# Patient Record
Sex: Male | Born: 1965 | Race: White | Hispanic: No | Marital: Single | Smoking: Former smoker
Health system: Southern US, Community
[De-identification: ages and names within clinical notes are randomized; demographics above are authoritative.]

## PROBLEM LIST (undated history)

## (undated) DIAGNOSIS — I872 Venous insufficiency (chronic) (peripheral): Secondary | ICD-10-CM

## (undated) DIAGNOSIS — K635 Polyp of colon: Secondary | ICD-10-CM

## (undated) DIAGNOSIS — J45909 Unspecified asthma, uncomplicated: Secondary | ICD-10-CM

## (undated) DIAGNOSIS — E669 Obesity, unspecified: Secondary | ICD-10-CM

## (undated) DIAGNOSIS — G4733 Obstructive sleep apnea (adult) (pediatric): Secondary | ICD-10-CM

## (undated) DIAGNOSIS — Z87891 Personal history of nicotine dependence: Secondary | ICD-10-CM

## (undated) DIAGNOSIS — I10 Essential (primary) hypertension: Secondary | ICD-10-CM

## (undated) DIAGNOSIS — E789 Disorder of lipoprotein metabolism, unspecified: Secondary | ICD-10-CM

## (undated) DIAGNOSIS — T7840XA Allergy, unspecified, initial encounter: Secondary | ICD-10-CM

## (undated) HISTORY — DX: Personal history of nicotine dependence: Z87.891

## (undated) HISTORY — DX: Disorder of lipoprotein metabolism, unspecified: E78.9

## (undated) HISTORY — DX: Obstructive sleep apnea (adult) (pediatric): G47.33

## (undated) HISTORY — DX: Allergy, unspecified, initial encounter: T78.40XA

## (undated) HISTORY — PX: COLONOSCOPY W/ POLYPECTOMY: SHX1380

## (undated) HISTORY — DX: Unspecified asthma, uncomplicated: J45.909

## (undated) HISTORY — DX: Essential (primary) hypertension: I10

## (undated) HISTORY — PX: KNEE ARTHROSCOPY: SUR90

## (undated) HISTORY — PX: ROUX-EN-Y GASTRIC BYPASS: SHX1104

## (undated) HISTORY — DX: Polyp of colon: K63.5

## (undated) HISTORY — DX: Obesity, unspecified: E66.9

---
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
## 2014-08-04 DIAGNOSIS — T7840XA Allergy, unspecified, initial encounter: Secondary | ICD-10-CM | POA: Insufficient documentation

## 2014-08-04 DIAGNOSIS — J45909 Unspecified asthma, uncomplicated: Secondary | ICD-10-CM | POA: Insufficient documentation

## 2014-08-04 DIAGNOSIS — I10 Essential (primary) hypertension: Secondary | ICD-10-CM | POA: Insufficient documentation

## 2014-08-04 DIAGNOSIS — Z9989 Dependence on other enabling machines and devices: Secondary | ICD-10-CM

## 2014-08-04 DIAGNOSIS — G4733 Obstructive sleep apnea (adult) (pediatric): Secondary | ICD-10-CM | POA: Insufficient documentation

## 2014-08-25 ENCOUNTER — Ambulatory Visit: Payer: Self-pay | Admitting: Family Medicine

## 2014-09-19 ENCOUNTER — Ambulatory Visit: Payer: Self-pay | Admitting: Family Medicine

## 2016-10-16 ENCOUNTER — Encounter: Payer: Self-pay | Admitting: Physician Assistant

## 2017-03-11 LAB — HM COLONOSCOPY

## 2017-09-03 ENCOUNTER — Encounter (INDEPENDENT_AMBULATORY_CARE_PROVIDER_SITE_OTHER): Payer: Self-pay

## 2017-09-03 ENCOUNTER — Ambulatory Visit (INDEPENDENT_AMBULATORY_CARE_PROVIDER_SITE_OTHER): Payer: Managed Care, Other (non HMO) | Admitting: Physician Assistant

## 2017-09-03 ENCOUNTER — Encounter: Payer: Self-pay | Admitting: Physician Assistant

## 2017-09-03 VITALS — BP 149/79 | HR 82 | Temp 97.9°F | Ht 73.0 in | Wt >= 6400 oz

## 2017-09-03 DIAGNOSIS — Z7689 Persons encountering health services in other specified circumstances: Secondary | ICD-10-CM

## 2017-09-03 DIAGNOSIS — J4530 Mild persistent asthma, uncomplicated: Secondary | ICD-10-CM

## 2017-09-03 DIAGNOSIS — Z130 Encounter for screening for diseases of the blood and blood-forming organs and certain disorders involving the immune mechanism: Secondary | ICD-10-CM | POA: Diagnosis not present

## 2017-09-03 DIAGNOSIS — Z13220 Encounter for screening for lipoid disorders: Secondary | ICD-10-CM

## 2017-09-03 DIAGNOSIS — Z87891 Personal history of nicotine dependence: Secondary | ICD-10-CM

## 2017-09-03 DIAGNOSIS — Z9989 Dependence on other enabling machines and devices: Secondary | ICD-10-CM

## 2017-09-03 DIAGNOSIS — E66813 Obesity, class 3: Secondary | ICD-10-CM

## 2017-09-03 DIAGNOSIS — I10 Essential (primary) hypertension: Secondary | ICD-10-CM

## 2017-09-03 DIAGNOSIS — Z131 Encounter for screening for diabetes mellitus: Secondary | ICD-10-CM

## 2017-09-03 DIAGNOSIS — G4733 Obstructive sleep apnea (adult) (pediatric): Secondary | ICD-10-CM | POA: Diagnosis not present

## 2017-09-03 DIAGNOSIS — Z6841 Body Mass Index (BMI) 40.0 and over, adult: Secondary | ICD-10-CM

## 2017-09-03 DIAGNOSIS — Z23 Encounter for immunization: Secondary | ICD-10-CM | POA: Diagnosis not present

## 2017-09-03 MED ORDER — OLMESARTAN MEDOXOMIL-HCTZ 40-25 MG PO TABS: 1.0000 | ORAL_TABLET | Freq: Every day | ORAL | 0 refills | Status: DC

## 2017-09-03 MED ORDER — ASPIRIN EC 81 MG PO TBEC: 81.0000 mg | DELAYED_RELEASE_TABLET | Freq: Every day | ORAL | 3 refills | Status: AC

## 2017-09-03 MED ORDER — ADVAIR DISKUS 100-50 MCG/DOSE IN AEPB: 1.0000 | INHALATION_SPRAY | Freq: Two times a day (BID) | RESPIRATORY_TRACT | 2 refills | Status: DC

## 2017-09-03 NOTE — Progress Notes (Signed)
HPI:                                                                Jordan Moody is a 52 y.o. male who presents to [facility]: Primary Care Sports Medicine today to establish care  Current concerns: medication refills  OSA on CPAP: states he saw his sleep specialist in [place] 3 months ago. No concerns. Plans to establish with a sleep doctor here. Sleep study was completed at [facility] in [place], [place] 10 years ago.   Asthma: taking Advair daily. Has not used rescue inhaler in years. No exacerbations in the last year. Denies nighttime cough. Denies dyspnea or wheezing.   HTN: taking Benicar daily. Compliant with medications. Does not check BP's at home. Denies vision change, headache, chest pain with exertion, orthopnea, lightheadedness, syncope and edema. Risk factors include: obesity, male sex, family history of CAD   Depression screen [facility] 2/9 09/03/2017  Decreased Interest 0  Down, Depressed, Hopeless 0  PHQ - 2 Score 0    No flowsheet data found.    Past Medical History:  Diagnosis Date  . Allergy   . Asthma   . Colon polyps   . Former smoker   . Hypertension   . Obesity   . OSA (obstructive sleep apnea)    Past Surgical History:  Procedure Laterality Date  . COLONOSCOPY W/ POLYPECTOMY    . KNEE ARTHROSCOPY Right    Social History   Tobacco Use  . Smoking status: Former Smoker    Packs/day: 1.00    Years: 28.00    Pack years: 28.00    Types: Cigarettes    Last attempt to quit: 05/19/2013    Years since quitting: 4.2  . Smokeless tobacco: Never Used  Substance Use Topics  . Alcohol use: Yes    Alcohol/week: 3.6 oz    Types: 6 Standard drinks or equivalent per week   family history includes Colon cancer in his paternal grandfather; Heart attack in his maternal grandfather and paternal grandfather; Hyperlipidemia in his father; Hypertension in his mother; Stroke in his paternal grandfather.    ROS: negative except as  noted in the HPI  Medications: Current Outpatient Medications  Medication Sig Dispense Refill  . ADVAIR DISKUS 100-50 MCG/DOSE AEPB Inhale 1 puff into the lungs 2 (two) times daily. 60 each 2  . aspirin EC 81 MG tablet Take 1 tablet (81 mg total) by mouth daily. 90 tablet 3  . olmesartan-hydrochlorothiazide (BENICAR HCT) 40-25 MG tablet Take 1 tablet by mouth daily. 90 tablet 0   No current facility-administered medications for this visit.    No Known Allergies     Objective:  BP (!) 149/79   Pulse 82   Temp 97.9 F (36.6 C) (Oral)   Ht 6\' 1"  (1.854 m)   Wt (!) 411 lb (186.4 kg)   SpO2 95%   BMI 54.22 kg/m  Gen:  alert, not ill-appearing, no distress, appropriate for age, obese male HEENT: head normocephalic without obvious abnormality, conjunctiva and cornea clear, trachea midline Pulm: Normal work of breathing, normal phonation, clear to auscultation bilaterally, no wheezes, rales or rhonchi CV: Normal rate, regular rhythm, s1 and s2 distinct, no murmurs, clicks or rubs  Neuro: alert and  oriented x 3, no tremor MSK: extremities atraumatic, normal gait and station Skin: intact, no rashes on exposed skin, no jaundice, no cyanosis Psych: well-groomed, cooperative, good eye contact, euthymic mood, affect mood-congruent, speech is articulate, and thought processes clear and goal-directed    No results found for this or any previous visit (from the past 72 hour(s)). No results found.    Assessment and Plan: 52 y.o. male with   1. Encounter to establish care - reviewed PMh, PHS, PFH, medications and allergies - reviewed health maintenance - colonoscopy UTD 2017 per patient, [facility] - influenza given today - pneumovax UTD - negative PHQ2  2. Uncontrolled stage 2 hypertension BP Readings from Last 3 Encounters:  09/03/17 (!) 149/79  - BP severely elevated on initial BP check. Increasing thiazide from 12.5 to 25 mg -  counseled on therapeutic lifestyle changes - Goal <130/80 - baby aspirin for primary prevention - CBC - Comprehensive metabolic panel - Lipid Panel w/reflex Direct LDL - olmesartan-hydrochlorothiazide (BENICAR HCT) 40-25 MG tablet; Take 1 tablet by mouth daily.  Dispense: 90 tablet; Refill: 0  3. Class 3 severe obesity due to excess calories with serious comorbidity in adult, unspecified BMI (HCC) Wt Readings from Last 3 Encounters:  09/03/17 (!) 411 lb (186.4 kg)  - Lipid Panel w/reflex Direct LDL - Hemoglobin A1c  4. Mild persistent asthma without complication - SpO2 95% on RA at rest, likely a restrictive component due to co-morbid obesity. Well controlled on Advair - ADVAIR DISKUS 100-50 MCG/DOSE AEPB; Inhale 1 puff into the lungs 2 (two) times daily.  Dispense: 60 each; Refill: 2  5. OSA on CPAP - [place] sleep [place] from [facility] in [place]  - referring to pulmonology to establish with sleep specialist   6. Screening for diabetes mellitus - Hemoglobin A1c  7. Encounter for screening for lipid disorder - Lipid Panel w/reflex Direct LDL  8. Former heavy tobacco smoker   9. Need for immunization against influenza - Flu Vaccine QUAD 36+ mos IM  Patient education and anticipatory guidance given Patient agrees with treatment plan Follow-up in 2 weeks for nurse BP check, then every 6 months for medication or sooner  as needed  Levonne Hubertharley E. Rikki Trosper PA-C

## 2017-09-03 NOTE — Patient Instructions (Signed)
For your blood pressure: - Switch to higher dose of Benicar (40-25 mg) - Start baby aspirin 81 mg to help prevent heart attack/stroke - Check blood pressure at home for the next 2 weeks - Check around the same time each day in a relaxed setting - Limit salt to <2000 mg/day - Follow DASH eating plan - limit alcohol to 2 standard drinks per day - avoid tobacco products - weight loss: 7% of current body weight - Follow-up in 2 weeks   Physical Activity Recommendations for modifying lipids and lowering blood pressure Engage in aerobic physical activity to reduce LDL-cholesterol, non-HDL-cholesterol, and blood pressure  Frequency: 3-4 sessions per week  Intensity: moderate to vigorous  Duration: 40 minutes on average  Physical Activity Recommendations for secondary prevention 1. Aerobic exercise  Frequency: 3-5 sessions per week  Intensity: 50-80% capacity  Duration: 20 - 60 minutes  Examples: walking, treadmill, cycling, rowing, stair climbing, and arm/leg ergometry  2. Resistance exercise  Frequency: 2-3 sessions per week  Intensity: 10-15 repetitions/set to moderate fatigue  Duration: 1-3 sets of 8-10 upper and lower body exercises  Examples: calisthenics, elastic bands, cuff/hand weights, dumbbels, free weights, wall pulleys, and weight machines  Heart-Healthy Lifestyle  Eating a diet rich in vegetables, fruits and whole grains: also includes low-fat dairy products, poultry, fish, legumes, and nuts; limit intake of sweets, sugar-sweetened beverages and red meats  Getting regular exercise  Maintaining a healthy weight  Not smoking or getting help quitting  Staying on top of your health; for some people, lifestyle changes alone may not be enough to prevent a heart attack or stroke. In these cases, taking a statin at the right dose will most likely be necessary

## 2017-09-04 ENCOUNTER — Encounter: Payer: Self-pay | Admitting: Physician Assistant

## 2017-09-04 DIAGNOSIS — E789 Disorder of lipoprotein metabolism, unspecified: Secondary | ICD-10-CM | POA: Insufficient documentation

## 2017-09-04 HISTORY — DX: Disorder of lipoprotein metabolism, unspecified: E78.9

## 2017-09-04 LAB — COMPREHENSIVE METABOLIC PANEL
AG RATIO: 1.6 (calc) (ref 1.0–2.5)
ALBUMIN MSPROF: 4.4 g/dL (ref 3.6–5.1)
ALT: 28 U/L (ref 9–46)
AST: 17 U/L (ref 10–35)
Alkaline phosphatase (APISO): 52 U/L (ref 40–115)
BILIRUBIN TOTAL: 0.4 mg/dL (ref 0.2–1.2)
BUN: 15 mg/dL (ref 7–25)
CALCIUM: 9.7 mg/dL (ref 8.6–10.3)
CO2: 28 mmol/L (ref 20–32)
Chloride: 101 mmol/L (ref 98–110)
Creat: 0.71 mg/dL (ref 0.70–1.33)
GLUCOSE: 89 mg/dL (ref 65–99)
Globulin: 2.8 g/dL (calc) (ref 1.9–3.7)
POTASSIUM: 4.2 mmol/L (ref 3.5–5.3)
SODIUM: 137 mmol/L (ref 135–146)
TOTAL PROTEIN: 7.2 g/dL (ref 6.1–8.1)

## 2017-09-04 LAB — CBC
HEMATOCRIT: 40.9 % (ref 38.5–50.0)
Hemoglobin: 14.1 g/dL (ref 13.2–17.1)
MCH: 30.1 pg (ref 27.0–33.0)
MCHC: 34.5 g/dL (ref 32.0–36.0)
MCV: 87.2 fL (ref 80.0–100.0)
MPV: 10.8 fL (ref 7.5–12.5)
Platelets: 273 10*3/uL (ref 140–400)
RBC: 4.69 10*6/uL (ref 4.20–5.80)
RDW: 12.5 % (ref 11.0–15.0)
WBC: 5.5 10*3/uL (ref 3.8–10.8)

## 2017-09-04 LAB — LIPID PANEL W/REFLEX DIRECT LDL
Cholesterol: 195 mg/dL (ref <200)
HDL: 58 mg/dL (ref >40)
LDL CHOLESTEROL (CALC): 112 mg/dL — AB
NON-HDL CHOLESTEROL (CALC): 137 mg/dL — AB (ref <130)
Total CHOL/HDL Ratio: 3.4 (calc) (ref <5.0)
Triglycerides: 132 mg/dL (ref <150)

## 2017-09-04 LAB — HEMOGLOBIN A1C
HEMOGLOBIN A1C: 5.4 %{Hb} (ref <5.7)
Mean Plasma Glucose: 108 (calc)
eAG (mmol/L): 6 (calc)

## 2017-09-04 NOTE — Progress Notes (Signed)
Your labs look great - normal kidney function - cholesterol in a healthy range - normal blood counts - no evidence of diabetes

## 2017-09-19 ENCOUNTER — Ambulatory Visit (INDEPENDENT_AMBULATORY_CARE_PROVIDER_SITE_OTHER): Payer: Managed Care, Other (non HMO) | Admitting: Physician Assistant

## 2017-09-19 VITALS — BP 128/67 | HR 73 | Wt >= 6400 oz

## 2017-09-19 DIAGNOSIS — I10 Essential (primary) hypertension: Secondary | ICD-10-CM | POA: Diagnosis not present

## 2017-09-19 NOTE — Progress Notes (Signed)
Pt came into clinic today for BP check. At last OV his BP Rx was increased to Benicar 40-25mg . Pt has been taking the increased dose, no negative side effects. He also started weight watchers again, he is down 7 lbs. Advised to continue current therapy and see PCP for BP every 6 months. No refills needed at this time. No further questions.   Vitals:   09/19/17 1518  BP: 128/67  Pulse: 73

## 2017-11-25 ENCOUNTER — Other Ambulatory Visit: Payer: Self-pay | Admitting: Physician Assistant

## 2017-11-25 DIAGNOSIS — I10 Essential (primary) hypertension: Secondary | ICD-10-CM

## 2018-05-01 ENCOUNTER — Encounter: Payer: Self-pay | Admitting: Physician Assistant

## 2018-05-01 ENCOUNTER — Ambulatory Visit (INDEPENDENT_AMBULATORY_CARE_PROVIDER_SITE_OTHER): Payer: Managed Care, Other (non HMO) | Admitting: Physician Assistant

## 2018-05-01 VITALS — BP 114/74 | HR 99 | Temp 98.2°F | Wt >= 6400 oz

## 2018-05-01 DIAGNOSIS — I83028 Varicose veins of left lower extremity with ulcer other part of lower leg: Secondary | ICD-10-CM

## 2018-05-01 DIAGNOSIS — Z23 Encounter for immunization: Secondary | ICD-10-CM | POA: Diagnosis not present

## 2018-05-01 DIAGNOSIS — I83009 Varicose veins of unspecified lower extremity with ulcer of unspecified site: Secondary | ICD-10-CM | POA: Insufficient documentation

## 2018-05-01 DIAGNOSIS — I872 Venous insufficiency (chronic) (peripheral): Secondary | ICD-10-CM

## 2018-05-01 DIAGNOSIS — L97821 Non-pressure chronic ulcer of other part of left lower leg limited to breakdown of skin: Secondary | ICD-10-CM | POA: Diagnosis not present

## 2018-05-01 DIAGNOSIS — L97901 Non-pressure chronic ulcer of unspecified part of unspecified lower leg limited to breakdown of skin: Secondary | ICD-10-CM

## 2018-05-01 HISTORY — DX: Non-pressure chronic ulcer of unspecified part of unspecified lower leg limited to breakdown of skin: L97.901

## 2018-05-01 HISTORY — DX: Varicose veins of unspecified lower extremity with ulcer of unspecified site: I83.009

## 2018-05-01 HISTORY — DX: Venous insufficiency (chronic) (peripheral): I87.2

## 2018-05-01 MED ORDER — MEDICAL COMPRESSION SOCKS MISC: 0 refills | Status: DC

## 2018-05-01 NOTE — Progress Notes (Signed)
HPI:                                                                Jordan Moody is a 52 y.o. male who [name] today for rash/sore  For the last 10 days patient has noted a pruritic red rash on his left lower extremity.  He also noted a small sore that occasionally drained some clear fluid.  Rash is mildly tender.  Denies fever, chills, calf tenderness, claudication.   No flowsheet data found.    Past Medical History:  Diagnosis Date  . Allergy   . Asthma   . Borderline high cholesterol 09/04/2017   10-yr ASCVD risk 4.9%  . Colon polyps   . Former smoker   . Hypertension   . Obesity   . OSA (obstructive sleep apnea)    Past Surgical History:  Procedure Laterality Date  . COLONOSCOPY W/ POLYPECTOMY    . KNEE ARTHROSCOPY Right    Social History   Tobacco Use  . Smoking status: Former Smoker    Packs/day: 1.00    Years: 28.00    Pack years: 28.00    Types: Cigarettes    Last attempt to quit: 05/19/2013    Years since quitting: 4.9  . Smokeless tobacco: Never Used  Substance Use Topics  . Alcohol use: Yes    Alcohol/week: 6.0 standard drinks    Types: 6 Standard drinks or equivalent per week   family history includes Colon cancer in his paternal grandfather; Heart attack in his maternal grandfather and paternal grandfather; Hyperlipidemia in his father; Hypertension in his mother; Stroke in his paternal grandfather.    ROS: negative except as noted in the HPI  Medications: Current Outpatient Medications  Medication Sig Dispense Refill  . ADVAIR DISKUS 100-50 MCG/DOSE AEPB Inhale 1 puff into the lungs 2 (two) times daily. 60 each 2  . aspirin EC 81 MG tablet Take 1 tablet (81 mg total) by mouth daily. 90 tablet 3  . Elastic Bandages & Supports (MEDICAL COMPRESSION SOCKS) MISC L-XL circ., knee high, medium compression Wear daily 2 each 0  . olmesartan-hydrochlorothiazide (BENICAR HCT) 40-25 MG tablet  TAKE 1 TABLET BY MOUTH EVERY DAY 90 tablet 1   No current facility-administered medications for this visit.    No Known Allergies     Objective:  BP 114/74   Pulse 99   Temp 98.2 F (36.8 C) (Oral)   Wt (!) 424 lb (192.3 kg)   BMI 55.94 kg/m  Gen:  alert, not ill-appearing, no distress, appropriate for age, obese male Pulm: Normal work of breathing, normal phonation, clear to auscultation bilaterally, no wheezes, rales or rhonchi CV: Normal rate, regular rhythm, s1 and s2 distinct, no murmurs, clicks or rubs  MSK: extremities atraumatic, normal gait and station, 1+ peripheral edema to the mid-leg bilaterally Skin:  Left anterior lower extremity there is a purpuric rash with fine scale, there is a 2 cm x 2.5 cm shallow ulceration at the medial margin of the rash, no warmth, no induration, no drainage   No results found for this or any previous visit (from the past 72 hour(s)). No results found.    Assessment and Plan: 52 y.o. male with   .  Jordan Moody was seen today for wound check.  Diagnoses and all orders for this visit:  Venous stasis ulcer of other part of left lower leg limited to breakdown of skin, unspecified whether varicose veins present (HCC) -     Elastic Bandages & Supports (MEDICAL COMPRESSION SOCKS) MISC; L-XL circ., knee high, medium compression Wear daily  Need for immunization against influenza -     Flu Vaccine QUAD 36+ mos IM  Venous insufficiency of both lower extremities -     Elastic Bandages & Supports (MEDICAL COMPRESSION SOCKS) MISC; L-XL circ., knee high, medium compression Wear daily    Rash consistent with venous stasis dermatitis and early venous ulcer Patient placed in Unna boot Counseled on general measures for venous stasis including elevation, ambulation, and regular use of compression socks   Patient education and anticipatory guidance given Patient agrees with treatment plan Follow-up in 1 week for nurse unna boot change or sooner  as needed if symptoms worsen or fail to improve  Levonne Hubert PA-C

## 2018-05-01 NOTE — Patient Instructions (Signed)
Venous Ulcer A venous ulcer is a shallow sore on your lower leg that is caused by poor circulation in your veins. This condition used to be called stasis ulcer. Veins have valves that help return blood to the heart. If these valves do not work properly, it can cause blood to flow backward and to back up into the veins near the skin. When that happens, blood can pool in your lower legs. The blood can then leak out of your veins, which can irritate your skin. This may cause a break in your skin that becomes a venous ulcer. Venous ulcer is the most common type of lower leg ulcer. You may have venous ulcers on one leg or on both legs. The area where this condition most commonly develops is around the ankles. A venous ulcer may last for a long time (chronic ulcer) or it may return repeatedly (recurrent ulcer). What are the causes? Any condition that causes poor circulation to your legs can lead to a venous ulcer. What increases the risk? This condition is more likely to develop in:  People who are 65 years of age or older.  People who are overweight.  People who are not active.  People who have had a leg ulcer in the past.  People who have clots in their lower leg veins (deep vein thrombosis).  People who have inflammation of their leg veins (phlebitis).  Women who have given birth.  People who smoke.  What are the signs or symptoms? The most common symptom of this condition is an open sore near your ankle. Other symptoms may include:  Swelling.  Thickening of the skin.  Fluid leaking from the ulcer.  Bleeding.  Itching.  Pain and swelling that gets worse when you stand up and feels better when you raise your leg.  Blotchy skin.  Darkening of the skin.  How is this diagnosed? Your health care provider may suspect a venous ulcer based on your medical history and your risk factors. Your health care provider will check the skin on your legs. Other tests may be done to learn more  about the ulcer and to determine the best way to treat it. Tests that may be done include:  Measuring the blood pressure in your arms and legs.  Using sound waves (ultrasound) to measure the blood flow in your leg veins.  How is this treated? You may need to try several different types of treatment to get your venous ulcer to heal. Healing may take a long time. Treatment may include:  Keeping your leg raised (elevated).  Wearing a type of bandage or stocking to compress the veins of your leg (compression therapy). Venous wounds are not likely to heal or to stay healed without compression.  Taking medicines to improve blood flow.  Taking antibiotic medicines to treat infection.  Cleaning your ulcer and removing any dead tissue from the wound (debridement).  Placing various types of medicated bandage (dressings) or medicated wraps on your ulcer. This helps the ulcer to heal and helps to prevent infection.  Surgery is sometimes needed to close the wound using a piece of skin taken from another area of your body (graft). You may need surgery if other treatments are not working or if your ulcer is very deep. Follow these instructions at home: Wound care  Follow instructions from your health care provider about: ? How to take care of your wound. ? When and how you should change your bandage (dressing). ? When you should   remove your dressing. If your dressing is dry and sticks to your leg when you try to remove it, moisten or wet the dressing with saline solution or water so that the dressing can be removed without harming your skin or wound tissue.  Check your wound every day for signs of infection. Have a caregiver do this for you if you are not able to do it yourself. Check for: ? More redness, swelling, or pain. ? More fluid or blood. ? Pus, warmth, or a bad smell. Medicines  Take over-the-counter and prescription medicines only as told by your health care provider.  If you were  prescribed an antibiotic medicine, take it or apply it as told by your health care provider. Do not stop taking or using the antibiotic even if your condition improves. Activity  Do not stand or sit in one position for a long period of time. Rest with your legs raised during the day. If possible, keep your legs above your heart for 30 minutes, 3-4 times a day, or as told by your health care provider.  Do not sit with your legs crossed.  Walk often to increase the blood flow in your legs.Ask your health care provider what level of activity is safe for you.  If you are taking a long ride in a car or plane, take a break to walk around at least once every two hours, or as often as your health care provider recommends. Ask your health care provider if you should take aspirin before long trips. General instructions   Wear elastic stockings, compression stockings, or support hose as told by your health care provider. This is very important.  Raise the foot of your bed as told by your health care provider.  Do not smoke.  Keep all follow-up visits as told by your health care provider. This is important. Contact a health care provider if:  You have a fever.  Your ulcer is getting larger or is not healing.  Your pain gets worse.  You have more redness or swelling around your ulcer.  You have more fluid, blood, or pus coming from your ulcer after it has been cleaned by you or your health care provider.  You have warmth or a bad smell coming from your ulcer. This information is not intended to replace advice given to you by your health care provider. Make sure you discuss any questions you have with your health care provider. Document Released: 04/30/2001 Document Revised: 01/11/2016 Document Reviewed: 12/14/2014 [employer] Interactive Patient Education  2018 [employer]  

## 2018-05-08 ENCOUNTER — Ambulatory Visit: Payer: Managed Care, Other (non HMO) | Admitting: Physician Assistant

## 2018-05-11 ENCOUNTER — Encounter: Payer: Self-pay | Admitting: Physician Assistant

## 2018-05-11 ENCOUNTER — Ambulatory Visit (INDEPENDENT_AMBULATORY_CARE_PROVIDER_SITE_OTHER): Payer: Managed Care, Other (non HMO) | Admitting: Physician Assistant

## 2018-05-11 VITALS — BP 140/82 | HR 83 | Resp 14 | Wt >= 6400 oz

## 2018-05-11 DIAGNOSIS — J4530 Mild persistent asthma, uncomplicated: Secondary | ICD-10-CM | POA: Diagnosis not present

## 2018-05-11 DIAGNOSIS — I872 Venous insufficiency (chronic) (peripheral): Secondary | ICD-10-CM

## 2018-05-11 DIAGNOSIS — Z6841 Body Mass Index (BMI) 40.0 and over, adult: Secondary | ICD-10-CM

## 2018-05-11 DIAGNOSIS — I10 Essential (primary) hypertension: Secondary | ICD-10-CM

## 2018-05-11 MED ORDER — OLMESARTAN MEDOXOMIL-HCTZ 40-25 MG PO TABS: 1.0000 | ORAL_TABLET | Freq: Every day | ORAL | 1 refills | Status: DC

## 2018-05-11 MED ORDER — ADVAIR DISKUS 100-50 MCG/DOSE IN AEPB: 1.0000 | INHALATION_SPRAY | Freq: Two times a day (BID) | RESPIRATORY_TRACT | 2 refills | Status: DC

## 2018-05-11 NOTE — Patient Instructions (Addendum)
2400 calories per day / lose 2 pounds per week Use MyFitnessPal app to log daily intake of food, drink and exercise.  Make snacks high in protein (>10g) and low in carbs (<15g). Stay away from high sugar drinks and foods.  Consider getting a Education officer, museumit Bit or Garmin to track daily steps.  Aim for 10,000 steps per day.  Stay active! Try to work out 3-4 days per week for 30-45 minutes. Aim for 64 oz of water each day. Work on stress reduction, meal planning and 8 hours of sleep at night. Don't skip meals. Can substitute a protein drink for one meal per day    DASH Eating Plan DASH stands for "Dietary Approaches to Stop Hypertension." The DASH eating plan is a healthy eating plan that has been shown to reduce high blood pressure (hypertension). It may also reduce your risk for type 2 diabetes, heart disease, and stroke. The DASH eating plan may also help with weight loss. What are tips for following this plan? General guidelines  Avoid eating more than 2,300 mg (milligrams) of salt (sodium) a day. If you have hypertension, you may need to reduce your sodium intake to 1,500 mg a day.  Limit alcohol intake to no more than 1 drink a day for nonpregnant women and 2 drinks a day for men. One drink equals 12 oz of beer, 5 oz of wine, or 1 oz of hard liquor.  Work with your health care provider to maintain a healthy body weight or to lose weight. Ask what an ideal weight is for you.  Get at least 30 minutes of exercise that causes your heart to beat faster (aerobic exercise) most days of the week. Activities may include walking, swimming, or biking.  Work with your health care provider or diet and nutrition specialist (dietitian) to adjust your eating plan to your individual calorie needs. Reading food labels  Check food labels for the amount of sodium per serving. Choose foods with less than 5 percent of the Daily Value of sodium. Generally, foods with less than 300 mg of sodium per serving fit into  this eating plan.  To find whole grains, look for the word "whole" as the first word in the ingredient list. Shopping  Buy products labeled as "low-sodium" or "no salt added."  Buy fresh foods. Avoid canned foods and premade or frozen meals. Cooking  Avoid adding salt when cooking. Use salt-free seasonings or herbs instead of table salt or sea salt. Check with your health care provider or pharmacist before using salt substitutes.  Do not fry foods. Cook foods using healthy methods such as baking, boiling, grilling, and broiling instead.  Cook with heart-healthy oils, such as olive, canola, soybean, or sunflower oil. Meal planning   Eat a balanced diet that includes: ? 5 or more servings of fruits and vegetables each day. At each meal, try to fill half of your plate with fruits and vegetables. ? Up to 6-8 servings of whole grains each day. ? Less than 6 oz of lean meat, poultry, or fish each day. A 3-oz serving of meat is about the same size as a deck of cards. One egg equals 1 oz. ? 2 servings of low-fat dairy each day. ? A serving of nuts, seeds, or beans 5 times each week. ? Heart-healthy fats. Healthy fats called Omega-3 fatty acids are found in foods such as flaxseeds and coldwater fish, like sardines, salmon, and mackerel.  Limit how much you eat of the following: ?  Canned or prepackaged foods. ? Food that is high in trans fat, such as fried foods. ? Food that is high in saturated fat, such as fatty meat. ? Sweets, desserts, sugary drinks, and other foods with added sugar. ? Full-fat dairy products.  Do not salt foods before eating.  Try to eat at least 2 vegetarian meals each week.  Eat more home-cooked food and less restaurant, buffet, and fast food.  When eating at a restaurant, ask that your food be prepared with less salt or no salt, if possible. What foods are recommended? The items listed may not be a complete list. Talk with your dietitian about what dietary  choices are best for you. Grains Whole-grain or whole-wheat bread. Whole-grain or whole-wheat pasta. Brown rice. [place]. Bulgur. Whole-grain and low-sodium cereals. Pita bread. Low-fat, low-sodium crackers. Whole-wheat flour tortillas. Vegetables Fresh or frozen vegetables (raw, steamed, roasted, or grilled). Low-sodium or reduced-sodium tomato and vegetable juice. Low-sodium or reduced-sodium tomato sauce and tomato paste. Low-sodium or reduced-sodium canned vegetables. Fruits All fresh, dried, or frozen fruit. Canned fruit in natural juice (without added sugar). Meat and other protein foods Skinless chicken or [place]. Ground chicken or [place]. Pork with fat trimmed off. Fish and seafood. Egg whites. Dried beans, peas, or lentils. Unsalted nuts, nut butters, and seeds. Unsalted canned beans. Lean cuts of beef with fat trimmed off. Low-sodium, lean deli meat. Dairy Low-fat (1%) or fat-free (skim) milk. Fat-free, low-fat, or reduced-fat cheeses. Nonfat, low-sodium ricotta or cottage cheese. Low-fat or nonfat yogurt. Low-fat, low-sodium cheese. Fats and oils Soft margarine without trans fats. Vegetable oil. Low-fat, reduced-fat, or light mayonnaise and salad dressings (reduced-sodium). Canola, safflower, olive, soybean, and sunflower oils. Avocado. Seasoning and other foods Herbs. Spices. Seasoning mixes without salt. Unsalted popcorn and pretzels. Fat-free sweets. What foods are not recommended? The items listed may not be a complete list. Talk with your dietitian about what dietary choices are best for you. Grains Baked goods made with fat, such as croissants, muffins, or some breads. Dry pasta or rice meal packs. Vegetables Creamed or fried vegetables. Vegetables in a cheese sauce. Regular canned vegetables (not low-sodium or reduced-sodium). Regular canned tomato sauce and paste (not low-sodium or reduced-sodium). Regular tomato and vegetable juice (not low-sodium or reduced-sodium).  Angie Fava. Olives. Fruits Canned fruit in a light or heavy syrup. Fried fruit. Fruit in cream or butter sauce. Meat and other protein foods Fatty cuts of meat. Ribs. Fried meat. Berniece Salines. Sausage. Bologna and other processed lunch meats. Salami. Fatback. Hotdogs. Bratwurst. Salted nuts and seeds. Canned beans with added salt. Canned or smoked fish. Whole eggs or egg yolks. Chicken or [place] with skin. Dairy Whole or 2% milk, cream, and half-and-half. Whole or full-fat cream cheese. Whole-fat or sweetened yogurt. Full-fat cheese. Nondairy creamers. Whipped toppings. Processed cheese and cheese spreads. Fats and oils Butter. Stick margarine. Lard. Shortening. Ghee. Bacon fat. Tropical oils, such as coconut, palm kernel, or palm oil. Seasoning and other foods Salted popcorn and pretzels. Onion salt, garlic salt, seasoned salt, table salt, and sea salt. Worcestershire sauce. Tartar sauce. Barbecue sauce. Teriyaki sauce. Soy sauce, including reduced-sodium. Steak sauce. Canned and packaged gravies. Fish sauce. Oyster sauce. Cocktail sauce. Horseradish that you find on the shelf. Ketchup. Mustard. Meat flavorings and tenderizers. Bouillon cubes. Hot sauce and Tabasco sauce. Premade or packaged marinades. Premade or packaged taco seasonings. Relishes. Regular salad dressings. Where to find more information:  National Heart, Lung, and [place]: https://wilson-eaton.com/  American Heart Association: www.heart.org Summary  The DASH eating plan is a healthy eating plan that has been shown to reduce high blood pressure (hypertension). It may also reduce your risk for type 2 diabetes, heart disease, and stroke.  With the DASH eating plan, you should limit salt (sodium) intake to 2,300 mg a day. If you have hypertension, you may need to reduce your sodium intake to 1,500 mg a day.  When on the DASH eating plan, aim to eat more fresh fruits and vegetables, whole grains, lean proteins, low-fat dairy, and  heart-healthy fats.  Work with your health care provider or diet and nutrition specialist (dietitian) to adjust your eating plan to your individual calorie needs. This information is not intended to replace advice given to you by your health care provider. Make sure you discuss any questions you have with your health care provider. Document Released: 07/25/2011 Document Revised: 07/29/2016 Document Reviewed: 07/29/2016 Elsevier Interactive Patient Education  [employer].

## 2018-05-11 NOTE — Progress Notes (Signed)
HPI:                                                                Jordan Moody is a 52 y.o. male [name] today for venous stasis ulcer follow-up  He was placed in an unna boot on 05/01/18. He worse this for 1 week. Returns today. Reports ulcer has scabbed over and is no longer draining. He still has a pruritic rash. Denies fever, tenderness, warmth. He purchased compression socks and has been wearing them most days.  Requesting refills of his medications today.  He is also interested in weight loss. Current weight 426 lb, this is his highest adult weight.  He has never sought medical weight loss in the past. He has tried traditional diet and exercise regimens. In 2016 he lost approximately 50 pounds, but unfortunately regained the weight. He is currently a member of a local gym and plans to participate in boot camp/cardio kickboxing classes.    Depression screen PHQ 2/9 09/03/2017  Decreased Interest 0  Down, Depressed, Hopeless 0  PHQ - 2 Score 0    No flowsheet data found.    Past Medical History:  Diagnosis Date  . Allergy   . Asthma   . Borderline high cholesterol 09/04/2017   10-yr ASCVD risk 4.9%  . Colon polyps   . Former smoker   . Hypertension   . Obesity   . OSA (obstructive sleep apnea)    Past Surgical History:  Procedure Laterality Date  . COLONOSCOPY W/ POLYPECTOMY    . KNEE ARTHROSCOPY Right    Social History   Tobacco Use  . Smoking status: Former Smoker    Packs/day: 1.00    Years: 28.00    Pack years: 28.00    Types: Cigarettes    Last attempt to quit: 05/19/2013    Years since quitting: 4.9  . Smokeless tobacco: Never Used  Substance Use Topics  . Alcohol use: Yes    Alcohol/week: 6.0 standard drinks    Types: 6 Standard drinks or equivalent per week   family history includes Colon cancer in his paternal grandfather; Heart attack in his maternal grandfather and paternal  grandfather; Hyperlipidemia in his father; Hypertension in his mother; Stroke in his paternal grandfather.    ROS: negative except as noted in the HPI  Medications: Current Outpatient Medications  Medication Sig Dispense Refill  . aspirin EC 81 MG tablet Take 1 tablet (81 mg total) by mouth daily. 90 tablet 3  . Elastic Bandages & Supports (MEDICAL COMPRESSION SOCKS) MISC L-XL circ., knee high, medium compression Wear daily 2 each 0  . ADVAIR DISKUS 100-50 MCG/DOSE AEPB Inhale 1 puff into the lungs 2 (two) times daily. 180 each 2  . olmesartan-hydrochlorothiazide (BENICAR HCT) 40-25 MG tablet Take 1 tablet by mouth daily. 90 tablet 1   No current facility-administered medications for this visit.    No Known Allergies     Objective:  BP 140/82   Pulse 83   Resp 14   Wt (!) 426 lb (193.2 kg)   SpO2 95%   BMI 56.20 kg/m  Gen:  alert, not ill-appearing, no distress, appropriate for age, obese male HEENT: head normocephalic without obvious abnormality,  conjunctiva and cornea clear, trachea midline Pulm: Normal work of breathing, normal phonation, clear to auscultation bilaterally, no wheezes, rales or rhonchi CV: Normal rate, regular rhythm, s1 and s2 distinct, no murmurs, clicks or rubs  Neuro: alert and oriented x 3, no tremor MSK: extremities atraumatic, normal gait and station, 1+ peripheral edema bilaterally Skin: distal left anterior lower extremity there is a hyperpigmented purple hued rash with some purpura, medial aspect there is a scabbed ulceration measuring approximately 4 mm   No results found for this or any previous visit (from the past 72 hour(s)). No results found.    Assessment and Plan: 52 y.o. male with   .Onix was seen today for follow-up.  Diagnoses and all orders for this visit:  Venous insufficiency of both lower extremities  Hypertension goal BP (blood pressure) < 130/80 -     olmesartan-hydrochlorothiazide (BENICAR HCT) 40-25 MG tablet; Take  1 tablet by mouth daily.  Mild persistent asthma without complication -     ADVAIR DISKUS 100-50 MCG/DOSE AEPB; Inhale 1 puff into the lungs 2 (two) times daily.  Class 3 severe obesity due to excess calories with serious comorbidity and body mass index (BMI) of 50.0 to 59.9 in adult [facility])   Venous insufficiency - venous stasis ulcer is healing - continue medical compression socks  - counseled on general measures  Obesity - patient interested in medical weight management. Discussed pharmacologic therapies to include Qsymia, Bleviq, Contrave and Saxenda. Patient will contact insurance regarding coverage - counseled on 2400 calorie diet. Recommended DASH eating plan for comorbid hypertension - counseled on increased aerobic exercise  Patient education and anticipatory guidance given Patient agrees with treatment plan Follow-up in 1 month for weight management or sooner as needed if symptoms worsen or fail to improve  Levonne Hubert PA-C

## 2018-05-14 ENCOUNTER — Other Ambulatory Visit: Payer: Self-pay

## 2018-05-14 ENCOUNTER — Encounter: Payer: Self-pay | Admitting: Emergency Medicine

## 2018-05-14 ENCOUNTER — Emergency Department (INDEPENDENT_AMBULATORY_CARE_PROVIDER_SITE_OTHER)
Admission: EM | Admit: 2018-05-14 | Discharge: 2018-05-14 | Disposition: A | Discharge status: Home / Self Care | Payer: Managed Care, Other (non HMO) | Source: Home / Self Care | Attending: Family Medicine | Admitting: Family Medicine

## 2018-05-14 DIAGNOSIS — J029 Acute pharyngitis, unspecified: Secondary | ICD-10-CM

## 2018-05-14 LAB — POCT RAPID STREP A (OFFICE): Rapid Strep A Screen: NEGATIVE

## 2018-05-14 NOTE — ED Provider Notes (Signed)
[facility]    CSN: [id number] Arrival date & time: 05/14/18  1643     History   Chief Complaint Chief Complaint  Patient presents with  . Sore Throat    HPI SALEM LEMBKE is a 52 y.o. male.   HPI DEVANTA DANIEL is a 52 y.o. male presenting to UC with c/o sore throat for about 6 days and Right ear pain that started yesterday. He noticed some sores in the back of his throat and wants to make sure he does not have strep throat. Denies fever, chills, n/v/d. He has taken Aleve with moderate relief. No known sick contacts.    Past Medical History:  Diagnosis Date  . Allergy   . Asthma   . Borderline high cholesterol 09/04/2017   10-yr ASCVD risk 4.9%  . Colon polyps   . Former smoker   . Hypertension   . Obesity   . OSA (obstructive sleep apnea)     Patient Active Problem List   Diagnosis Date Noted  . Venous ulcer, limited to breakdown of skin (HCC) 05/01/2018  . Venous insufficiency of both lower extremities 05/01/2018  . Borderline high cholesterol 09/04/2017  . Former heavy tobacco smoker 09/03/2017  . Class 3 severe obesity due to excess calories with serious comorbidity and body mass index (BMI) of 50.0 to 59.9 in adult (HCC) 09/03/2017  . Mild persistent asthma without complication 09/03/2017  . Allergic state 08/04/2014  . Asthma without status asthmaticus 08/04/2014  . Uncontrolled stage 2 hypertension 08/04/2014  . OSA on CPAP 08/04/2014    Past Surgical History:  Procedure Laterality Date  . COLONOSCOPY W/ POLYPECTOMY    . KNEE ARTHROSCOPY Right        Home Medications    Prior to Admission medications   Medication Sig Start Date End Date Taking? Authorizing Provider  ADVAIR DISKUS 100-50 MCG/DOSE AEPB Inhale 1 puff into the lungs 2 (two) times daily. 05/11/18   Carlis Stable, PA-C  aspirin EC 81 MG tablet Take 1 tablet (81 mg total) by mouth daily. 09/03/17   Carlis Stable, PA-C  Elastic Bandages & Supports  (MEDICAL COMPRESSION SOCKS) MISC L-XL circ., knee high, medium compression Wear daily 05/01/18   Carlis Stable, PA-C  olmesartan-hydrochlorothiazide (BENICAR HCT) 40-25 MG tablet Take 1 tablet by mouth daily. 05/11/18   Carlis Stable, PA-C    Family History Family History  Problem Relation Age of Onset  . Hypertension Mother   . Heart attack Maternal Grandfather   . Hyperlipidemia Father   . Heart attack Paternal Grandfather   . Colon cancer Paternal Grandfather   . Stroke Paternal Grandfather   . Diabetes Neg Hx     Social History Social History   Tobacco Use  . Smoking status: Former Smoker    Packs/day: 1.00    Years: 28.00    Pack years: 28.00    Types: Cigarettes    Last attempt to quit: 05/19/2013    Years since quitting: 4.9  . Smokeless tobacco: Never Used  Substance Use Topics  . Alcohol use: Yes    Alcohol/week: 6.0 standard drinks    Types: 6 Standard drinks or equivalent per week  . Drug use: No     Allergies   Patient has no known allergies.   Review of Systems Review of Systems  Constitutional: Negative for chills and fever.  HENT: Positive for congestion, ear pain, postnasal drip and sore throat. Negative for trouble swallowing and voice  change.   Respiratory: Negative for cough and shortness of breath.   Cardiovascular: Negative for chest pain and palpitations.  Gastrointestinal: Negative for abdominal pain, diarrhea, nausea and vomiting.  Musculoskeletal: Negative for arthralgias, back pain and myalgias.  Skin: Negative for rash.     Physical Exam Triage Vital Signs ED Triage Vitals  Enc Vitals Group     BP 05/14/18 1654 (!) 142/86     Pulse Rate 05/14/18 1654 85     Resp --      Temp 05/14/18 1654 97.8 F (36.6 C)     Temp Source 05/14/18 1654 Oral     SpO2 05/14/18 1654 96 %     Weight 05/14/18 1655 (!) 425 lb (192.8 kg)     Height 05/14/18 1655 6\' 1"  (1.854 m)     Head Circumference --      Peak Flow --        Pain Score 05/14/18 1655 5     Pain Loc --      Pain Edu? --      Excl. in GC? --    No data found.  Updated Vital Signs BP (!) 142/86 (BP Location: Right Arm)   Pulse 85   Temp 97.8 F (36.6 C) (Oral)   Ht 6\' 1"  (1.854 m)   Wt (!) 425 lb (192.8 kg)   SpO2 96%   BMI 56.07 kg/m   Visual Acuity Right Eye Distance:   Left Eye Distance:   Bilateral Distance:    Right Eye Near:   Left Eye Near:    Bilateral Near:     Physical Exam  Constitutional: He is oriented to person, place, and time. He appears well-developed and well-nourished.  Non-toxic appearance. He does not appear ill. No distress.  HENT:  Head: Normocephalic and atraumatic.  Right Ear: Tympanic membrane normal.  Left Ear: Tympanic membrane normal.  Nose: Nose normal. Right sinus exhibits no maxillary sinus tenderness and no frontal sinus tenderness. Left sinus exhibits no maxillary sinus tenderness and no frontal sinus tenderness.  Mouth/Throat: Uvula is midline and mucous membranes are normal. Oral lesions ( several shallow ulcerations on uvula) present. No uvula swelling. Posterior oropharyngeal erythema present. No oropharyngeal exudate, posterior oropharyngeal edema or tonsillar abscesses.  Eyes: EOM are normal.  Neck: Normal range of motion. Neck supple.  Cardiovascular: Normal rate and regular rhythm.  Pulmonary/Chest: Effort normal and breath sounds normal. No stridor. He has no wheezes. He has no rhonchi.  Musculoskeletal: Normal range of motion.  Lymphadenopathy:    He has no cervical adenopathy.  Neurological: He is alert and oriented to person, place, and time.  Skin: Skin is warm and dry. No rash noted.  Psychiatric: He has a normal mood and affect. His behavior is normal.  Nursing note and vitals reviewed.    UC Treatments / Results  Labs (all labs ordered are listed, but only abnormal results are displayed) Labs Reviewed  STREP A DNA PROBE  POCT RAPID STREP A (OFFICE)     EKG None  Radiology No results found.  Procedures Procedures (including critical care time)  Medications Ordered in UC Medications - No data to display  Initial Impression / Assessment and Plan / UC Course  I have reviewed the triage vital signs and the nursing notes.  Pertinent labs & imaging results that were available during my care of the patient were reviewed by me and considered in my medical decision making (see chart for details).     Rapid  strep: negative Culture sent Hx and exam c/w viral illness Encouraged symptomatic treatment Offered magic mouthwash with lidocaine, pt declined. Will try OTC medications.  Final Clinical Impressions(s) / UC Diagnoses   Final diagnoses:  Sore throat     Discharge Instructions      You may take 500mg  acetaminophen every 4-6 hours or in combination with ibuprofen 400-600mg  every 6-8 hours as needed for pain, inflammation, and fever.  Be sure to drink at least eight 8oz glasses of water to stay well hydrated and get at least 8 hours of sleep at night, preferably more while sick.   Please follow up with family medicine in 1 week if not improving.     ED Prescriptions    None     Controlled Substance Prescriptions  Controlled Substance Registry consulted? Not Applicable   Rolla Plate 05/14/18 1757

## 2018-05-14 NOTE — Discharge Instructions (Signed)
°  You may take 500mg acetaminophen every 4-6 hours or in combination with ibuprofen 400-600mg every 6-8 hours as needed for pain, inflammation, and fever. ° °Be sure to drink at least eight 8oz glasses of water to stay well hydrated and get at least 8 hours of sleep at night, preferably more while sick.  ° °Please follow up with family medicine in 1 week if not improving.  °

## 2018-05-14 NOTE — ED Triage Notes (Signed)
Sore throat x 6 days, Right ear pain since yesterday

## 2018-05-15 ENCOUNTER — Telehealth: Payer: Self-pay | Admitting: Emergency Medicine

## 2018-05-15 LAB — STREP A DNA PROBE: Group A Strep Probe: NOT DETECTED

## 2018-05-15 NOTE — Telephone Encounter (Signed)
Strep culture was neg 

## 2018-06-06 ENCOUNTER — Emergency Department (INDEPENDENT_AMBULATORY_CARE_PROVIDER_SITE_OTHER)
Admission: EM | Admit: 2018-06-06 | Discharge: 2018-06-06 | Disposition: A | Discharge status: Home / Self Care | Payer: Managed Care, Other (non HMO) | Source: Home / Self Care | Attending: Family Medicine | Admitting: Family Medicine

## 2018-06-06 ENCOUNTER — Encounter: Payer: Self-pay | Admitting: Emergency Medicine

## 2018-06-06 ENCOUNTER — Other Ambulatory Visit: Payer: Self-pay

## 2018-06-06 DIAGNOSIS — B9689 Other specified bacterial agents as the cause of diseases classified elsewhere: Secondary | ICD-10-CM

## 2018-06-06 DIAGNOSIS — J208 Acute bronchitis due to other specified organisms: Secondary | ICD-10-CM | POA: Diagnosis not present

## 2018-06-06 DIAGNOSIS — H6691 Otitis media, unspecified, right ear: Secondary | ICD-10-CM

## 2018-06-06 MED ORDER — AZITHROMYCIN 250 MG PO TABS: 250.0000 mg | ORAL_TABLET | Freq: Every day | ORAL | 0 refills | Status: DC

## 2018-06-06 MED ORDER — PREDNISONE 20 MG PO TABS: ORAL_TABLET | ORAL | 0 refills | Status: DC

## 2018-06-06 NOTE — ED Provider Notes (Signed)
[facility]    CSN: [id number] Arrival date & time: 06/06/18  1216     History   Chief Complaint Chief Complaint  Patient presents with  . URI  . Facial Pain    HPI DONTAY HARM is a 52 y.o. male.   HPI ERICK MURIN is a 52 y.o. male presenting to UC with c/o 2 weeks worsening cough, congestion, post-nasal drip, frontal HA and ear pain.  He has tried albuterol and OTC cough medication w/o relief. Pt is traveling on a plane soon.  Denies fever, chills, n/v/d.    Past Medical History:  Diagnosis Date  . Allergy   . Asthma   . Borderline high cholesterol 09/04/2017   10-yr ASCVD risk 4.9%  . Colon polyps   . Former smoker   . Hypertension   . Obesity   . OSA (obstructive sleep apnea)     Patient Active Problem List   Diagnosis Date Noted  . Venous ulcer, limited to breakdown of skin (HCC) 05/01/2018  . Venous insufficiency of both lower extremities 05/01/2018  . Borderline high cholesterol 09/04/2017  . Former heavy tobacco smoker 09/03/2017  . Class 3 severe obesity due to excess calories with serious comorbidity and body mass index (BMI) of 50.0 to 59.9 in adult (HCC) 09/03/2017  . Mild persistent asthma without complication 09/03/2017  . Allergic state 08/04/2014  . Asthma without status asthmaticus 08/04/2014  . Uncontrolled stage 2 hypertension 08/04/2014  . OSA on CPAP 08/04/2014    Past Surgical History:  Procedure Laterality Date  . COLONOSCOPY W/ POLYPECTOMY    . KNEE ARTHROSCOPY Right        Home Medications    Prior to Admission medications   Medication Sig Start Date End Date Taking? Authorizing Provider  ADVAIR DISKUS 100-50 MCG/DOSE AEPB Inhale 1 puff into the lungs 2 (two) times daily. 05/11/18  Yes Carlis Stable, PA-C  aspirin EC 81 MG tablet Take 1 tablet (81 mg total) by mouth daily. 09/03/17  Yes Carlis Stable, PA-C  olmesartan-hydrochlorothiazide (BENICAR HCT) 40-25 MG tablet Take 1 tablet by  mouth daily. 05/11/18  Yes Carlis Stable, PA-C  azithromycin (ZITHROMAX) 250 MG tablet Take 1 tablet (250 mg total) by mouth daily. Take first 2 tablets together, then 1 every day until finished. 06/06/18   Lurene Shadow, PA-C  Elastic Bandages & Supports (MEDICAL COMPRESSION SOCKS) MISC L-XL circ., knee high, medium compression Wear daily 05/01/18   Carlis Stable, PA-C  predniSONE (DELTASONE) 20 MG tablet 3 tabs po day one, then 2 po daily x 4 days 06/06/18   Lurene Shadow, PA-C    Family History Family History  Problem Relation Age of Onset  . Hypertension Mother   . Heart attack Maternal Grandfather   . Hyperlipidemia Father   . Heart attack Paternal Grandfather   . Colon cancer Paternal Grandfather   . Stroke Paternal Grandfather   . Diabetes Neg Hx     Social History Social History   Tobacco Use  . Smoking status: Former Smoker    Packs/day: 1.00    Years: 28.00    Pack years: 28.00    Types: Cigarettes    Last attempt to quit: 05/19/2013    Years since quitting: 5.0  . Smokeless tobacco: Never Used  Substance Use Topics  . Alcohol use: Yes    Alcohol/week: 6.0 standard drinks    Types: 6 Standard drinks or equivalent per week  . Drug  use: No     Allergies   Patient has no known allergies.   Review of Systems Review of Systems  Constitutional: Negative for chills and fever.  HENT: Positive for congestion, ear pain and sinus pressure. Negative for sore throat.   Respiratory: Positive for cough. Negative for shortness of breath.   Gastrointestinal: Negative for diarrhea, nausea and vomiting.  Neurological: Positive for headaches. Negative for dizziness and light-headedness.     Physical Exam Triage Vital Signs ED Triage Vitals  Enc Vitals Group     BP 06/06/18 1236 130/79     Pulse Rate 06/06/18 1236 75     Resp --      Temp 06/06/18 1236 97.7 F (36.5 C)     Temp Source 06/06/18 1236 Oral     SpO2 06/06/18 1236 96 %      Weight 06/06/18 1237 (!) 425 lb 1.9 oz (192.8 kg)     Height --      Head Circumference --      Peak Flow --      Pain Score 06/06/18 1236 5     Pain Loc --      Pain Edu? --      Excl. in GC? --    No data found.  Updated Vital Signs BP 130/79 (BP Location: Right Arm)   Pulse 75   Temp 97.7 F (36.5 C) (Oral)   Wt (!) 425 lb 1.9 oz (192.8 kg)   SpO2 96%   BMI 56.09 kg/m   Visual Acuity Right Eye Distance:   Left Eye Distance:   Bilateral Distance:    Right Eye Near:   Left Eye Near:    Bilateral Near:     Physical Exam  Constitutional: He is oriented to person, place, and time. He appears well-developed and well-nourished. No distress.  HENT:  Head: Normocephalic and atraumatic.  Right Ear: Tympanic membrane is erythematous and bulging.  Left Ear: Tympanic membrane normal.  Nose: Nose normal. Right sinus exhibits no maxillary sinus tenderness and no frontal sinus tenderness. Left sinus exhibits no maxillary sinus tenderness and no frontal sinus tenderness.  Mouth/Throat: Uvula is midline, oropharynx is clear and moist and mucous membranes are normal.  Eyes: EOM are normal.  Neck: Normal range of motion. Neck supple.  Cardiovascular: Normal rate and regular rhythm.  Pulmonary/Chest: Effort normal. No respiratory distress. He has wheezes. He has rhonchi.  Musculoskeletal: Normal range of motion.  Neurological: He is alert and oriented to person, place, and time.  Skin: Skin is warm and dry. He is not diaphoretic.  Psychiatric: He has a normal mood and affect. His behavior is normal.  Nursing note and vitals reviewed.    UC Treatments / Results  Labs (all labs ordered are listed, but only abnormal results are displayed) Labs Reviewed - No data to display  EKG None  Radiology No results found.  Procedures Procedures (including critical care time)  Medications Ordered in UC Medications - No data to display  Initial Impression / Assessment and Plan / UC  Course  I have reviewed the triage vital signs and the nursing notes.  Pertinent labs & imaging results that were available during my care of the patient were reviewed by me and considered in my medical decision making (see chart for details).     Hx and exam c/w acute bronchitis and Right AOM Will tx with antibiotics and prednisone  Final Clinical Impressions(s) / UC Diagnoses   Final diagnoses:  Acute bacterial  bronchitis  Right acute otitis media     Discharge Instructions      Please take antibiotics as prescribed and be sure to complete entire course even if you start to feel better to ensure infection does not come back.  Please follow up with family medicine in 1 week if not improving.     ED Prescriptions    Medication Sig Dispense Auth. Provider   azithromycin (ZITHROMAX) 250 MG tablet Take 1 tablet (250 mg total) by mouth daily. Take first 2 tablets together, then 1 every day until finished. 6 tablet Doroteo Glassman, Kenai Fluegel O, PA-C   predniSONE (DELTASONE) 20 MG tablet 3 tabs po day one, then 2 po daily x 4 days 11 tablet Lurene Shadow, PA-C     Controlled Substance Prescriptions [place] Controlled Substance Registry consulted? Not Applicable   Rolla Plate 06/06/18 1304

## 2018-06-06 NOTE — Discharge Instructions (Signed)
°  Please take antibiotics as prescribed and be sure to complete entire course even if you start to feel better to ensure infection does not come back. ° °Please follow up with family medicine in 1 week if not improving.  °

## 2018-06-06 NOTE — ED Triage Notes (Signed)
Here with 2 weeks cough, congestion post nasal drip and pain behind eyes, ear fullness bilateral. Using Albuterol inhaler. Denies fever, chills, n.v

## 2018-07-07 ENCOUNTER — Other Ambulatory Visit: Payer: Self-pay

## 2018-07-07 DIAGNOSIS — G4733 Obstructive sleep apnea (adult) (pediatric): Secondary | ICD-10-CM

## 2018-07-07 DIAGNOSIS — Z9989 Dependence on other enabling machines and devices: Principal | ICD-10-CM

## 2018-07-07 DIAGNOSIS — J4530 Mild persistent asthma, uncomplicated: Secondary | ICD-10-CM

## 2018-07-08 ENCOUNTER — Ambulatory Visit (INDEPENDENT_AMBULATORY_CARE_PROVIDER_SITE_OTHER): Payer: Managed Care, Other (non HMO) | Admitting: Pulmonary Disease

## 2018-07-08 ENCOUNTER — Telehealth: Payer: Self-pay | Admitting: Pulmonary Disease

## 2018-07-08 ENCOUNTER — Encounter: Payer: Self-pay | Admitting: Pulmonary Disease

## 2018-07-08 VITALS — BP 130/78 | HR 71 | Ht 73.0 in | Wt >= 6400 oz

## 2018-07-08 DIAGNOSIS — G4733 Obstructive sleep apnea (adult) (pediatric): Secondary | ICD-10-CM | POA: Diagnosis not present

## 2018-07-08 NOTE — Telephone Encounter (Signed)
Called patient unable to reach left message to give us a call back.

## 2018-07-08 NOTE — Addendum Note (Signed)
Addended by: Sylvester HarderMOORE, Tareq Dwan R on: 07/08/2018 03:53 PM   Modules accepted: Orders

## 2018-07-08 NOTE — Telephone Encounter (Signed)
Pt is calling back [phone] pt [facility] [phone]

## 2018-07-08 NOTE — Progress Notes (Signed)
Jordan Moody    [id number]    27-Dec-1965  Primary Care Physician:Cummings, Bernerd Phoharley Elizabeth, PA-C  Referring Physician: Riki RuskCummings, Charley Elizabeth, PA-C [address] [place], [place] [address]  Chief complaint:   Patient with a history of obstructive sleep apnea, in for initial evaluation  HPI:  Patient was diagnosed with obstructive sleep apnea few years back, has been using BiPAP Compliant with BiPAP use His machine is becoming dysfunctional Usually goes to bed about 11, takes him about 15 minutes to fall asleep Wakes up about once during the night Usually gets up in the morning about 720  Had a sleep study done about a year ago following which he had his pressures adjusted  Machine is currently not working well, humidification broke   Medical history significant for hypertension, asthma-well-controlled  No pertinent occupational history, reformed smoker  Outpatient Encounter Medications as of 07/08/2018  Medication Sig  . ADVAIR DISKUS 100-50 MCG/DOSE AEPB Inhale 1 puff into the lungs 2 (two) times daily.  Marland Kitchen. aspirin EC 81 MG tablet Take 1 tablet (81 mg total) by mouth daily.  Clinical research associate. Elastic Bandages & Supports (MEDICAL COMPRESSION SOCKS) MISC L-XL circ., knee high, medium compression Wear daily  . olmesartan-hydrochlorothiazide (BENICAR HCT) 40-25 MG tablet Take 1 tablet by mouth daily.  . [DISCONTINUED] azithromycin (ZITHROMAX) 250 MG tablet Take 1 tablet (250 mg total) by mouth daily. Take first 2 tablets together, then 1 every day until finished.  . [DISCONTINUED] predniSONE (DELTASONE) 20 MG tablet 3 tabs po day one, then 2 po daily x 4 days   No facility-administered encounter medications on file as of 07/08/2018.     Allergies as of 07/08/2018  . (No Known Allergies)    Past Medical History:  Diagnosis Date  . Allergy   . Asthma   . Borderline high cholesterol 09/04/2017   10-yr ASCVD risk 4.9%  . Colon polyps   . Former smoker   .  Hypertension   . Obesity   . OSA (obstructive sleep apnea)     Past Surgical History:  Procedure Laterality Date  . COLONOSCOPY W/ POLYPECTOMY    . KNEE ARTHROSCOPY Right     Family History  Problem Relation Age of Onset  . Hypertension Mother   . Heart attack Maternal Grandfather   . Hyperlipidemia Father   . Heart attack Paternal Grandfather   . Colon cancer Paternal Grandfather   . Stroke Paternal Grandfather   . Diabetes Neg Hx     Social History   Socioeconomic History  . Marital status: Single    Spouse name: Not on file  . Number of children: Not on file  . Years of education: Not on file  . Highest education level: Not on file  Occupational History  . Not on file  Social Needs  . Financial resource strain: Not on file  . Food insecurity:    Worry: Not on file    Inability: Not on file  . Transportation needs:    Medical: Not on file    Non-medical: Not on file  Tobacco Use  . Smoking status: Former Smoker    Packs/day: 1.00    Years: 28.00    Pack years: 28.00    Types: Cigarettes    Last attempt to quit: 05/19/2013    Years since quitting: 5.1  . Smokeless tobacco: Never Used  Substance and Sexual Activity  . Alcohol use: Yes    Alcohol/week:  6.0 standard drinks    Types: 6 Standard drinks or equivalent per week  . Drug use: No  . Sexual activity: Yes    Birth control/protection: None  Lifestyle  . Physical activity:    Days per week: Not on file    Minutes per session: Not on file  . Stress: Not on file  Relationships  . Social connections:    Talks on phone: Not on file    Gets together: Not on file    Attends religious service: Not on file    Active member of club or organization: Not on file    Attends meetings of clubs or organizations: Not on file    Relationship status: Not on file  . Intimate partner violence:    Fear of current or ex partner: Not on file    Emotionally abused: Not on file    Physically abused: Not on file     Forced sexual activity: Not on file  Other Topics Concern  . Not on file  Social History Narrative  . Not on file    Review of Systems  Constitutional: Negative.   HENT: Negative.   Respiratory: Positive for apnea. Negative for cough and shortness of breath.   Psychiatric/Behavioral: Positive for sleep disturbance.  All other systems reviewed and are negative.   Vitals:   07/08/18 1157  BP: 130/78  Pulse: 71  SpO2: 96%     Physical Exam  Constitutional: He appears well-developed and well-nourished.  HENT:  Head: Normocephalic and atraumatic.  Mallampati 3  Eyes: Pupils are equal, round, and reactive to light. Right eye exhibits no discharge. Left eye exhibits no discharge.  Neck: Normal range of motion. Neck supple. No tracheal deviation present. No thyromegaly present.  Cardiovascular: Normal rate and regular rhythm.  Pulmonary/Chest: Effort normal and breath sounds normal. No respiratory distress. He has no wheezes.  Abdominal: Soft. Bowel sounds are normal. He exhibits no distension. There is no tenderness.  Musculoskeletal: Normal range of motion. He exhibits no edema.  Neurological: He is alert.  Skin: He is not diaphoretic.     Data Reviewed: Compliance data did reveal excellent compliance 100%, residual AHI of 0.6  Assessment:  Obstructive sleep apnea-adequately treated with BiPAP therapy Currently on BiPAP of 17/13 with excellent compliance  Machine is dysfunctional present  Plan/Recommendations:  Will refer to a DME company  Put in an order for new BiPAP machine  Pathophysiology of sleep disordered breathing discussed Importance of exercise and weight loss discussed with the patient-he is currently exercising and watching his diet and has managed to lose over 10 pounds in the last few weeks  I will see him back in the office in about a year Encouraged to call if any significant concerns  Virl Diamond MD [facility] 07/08/2018, 12:19 PM  CC: Donzetta Kohut*

## 2018-07-08 NOTE — Telephone Encounter (Signed)
Pt is calling back [phone]

## 2018-07-08 NOTE — Patient Instructions (Signed)
Patient with a history of obstructive sleep apnea Machine is becoming dysfunctional  Excellent compliance on BiPAP, effective pressures  We will refer you to a DME company We will put in an order for new machine as well-if covered  I will see you back here in a year We will see you sooner if any problems Call with any concerns

## 2018-07-08 NOTE — Telephone Encounter (Signed)
Called and spoke with patient and advised him that we would need his sleep study for the DME company. He I s going to try and find out where he got this done so we can obtain this study. He will call back with this info.

## 2018-07-09 NOTE — Telephone Encounter (Signed)
Called and spoke with patient advised that the [facility] had [facility] was an [facility]NO I did requste that they send these results to us at [phone]. I will place an order for a Hst  Pre AO nothing further needed at this time.

## 2018-07-09 NOTE — Telephone Encounter (Signed)
Patient returned phone call; pt contact # [phone]

## 2018-07-13 ENCOUNTER — Telehealth: Payer: Self-pay | Admitting: Pulmonary Disease

## 2018-07-13 NOTE — Telephone Encounter (Signed)
Patient stated he had a sleep study about a year ago-this was the information patient provided to us in the office-[place].  Jordan SetaHeather did advise me that he had an oximetry recently  We are ordering a home sleep study He is on a BiPAP-he is compliant with BiPAP use

## 2018-07-13 NOTE — Telephone Encounter (Signed)
[facility] is requesting results from sleep study.    AO please advise on this, thank you.

## 2018-07-15 NOTE — Telephone Encounter (Signed)
AO please advise once available, thank you.

## 2018-07-15 NOTE — Telephone Encounter (Signed)
Called and spoke with representative she states the order is under review with the clinician for the home sleep study . Ref # [id number]  Will route to Heather to keep follow up on this approval.   Nothing further needed.

## 2018-07-15 NOTE — Telephone Encounter (Signed)
I sent a reply on 07/13/2018 I can see it in the encounters

## 2018-07-25 DIAGNOSIS — G4733 Obstructive sleep apnea (adult) (pediatric): Secondary | ICD-10-CM | POA: Diagnosis not present

## 2018-07-27 NOTE — Telephone Encounter (Addendum)
Patient was Approved # [id number] Patient is aware nothing further needed at this time.

## 2018-07-28 ENCOUNTER — Other Ambulatory Visit: Payer: Self-pay | Admitting: *Deleted

## 2018-07-28 DIAGNOSIS — G4733 Obstructive sleep apnea (adult) (pediatric): Secondary | ICD-10-CM

## 2018-07-30 DIAGNOSIS — G4733 Obstructive sleep apnea (adult) (pediatric): Secondary | ICD-10-CM | POA: Diagnosis not present

## 2018-08-06 ENCOUNTER — Telehealth: Payer: Self-pay | Admitting: Pulmonary Disease

## 2018-08-06 DIAGNOSIS — G4733 Obstructive sleep apnea (adult) (pediatric): Secondary | ICD-10-CM

## 2018-08-06 NOTE — Telephone Encounter (Signed)
Dr. Wynona Moody has reviewed the home sleep test this showed Severe sleep apnea.   Recommendations   Treatment options continue on th bipap with the settings 5 to 20 support of 4.    Weight loss measures .   Advise against driving while sleepy & against medication with sedative side effects.    Make appointment for 3 months for compliance with download with Dr. Wynona Moody.   Patient aware apt made and order placed.

## 2018-08-18 ENCOUNTER — Telehealth: Payer: Self-pay | Admitting: Pulmonary Disease

## 2018-08-18 NOTE — Telephone Encounter (Signed)
Refaxed order to [facility].  I may have mistakenly faxed order without stamping Olalere's name.  Made sure to stamp this order and he signed it electronically as well.  Nothing further needed.

## 2018-08-18 NOTE — Telephone Encounter (Signed)
Called and spoke with [place], [facility].  She stated that they received a CPAP order that has not been signed.  They need a signed order so they can give the Patient his CPAP.   Will route message to [facility]

## 2018-11-11 ENCOUNTER — Telehealth (INDEPENDENT_AMBULATORY_CARE_PROVIDER_SITE_OTHER): Payer: Managed Care, Other (non HMO) | Admitting: Nurse Practitioner

## 2018-11-11 ENCOUNTER — Ambulatory Visit: Payer: Managed Care, Other (non HMO) | Admitting: Pulmonary Disease

## 2018-11-11 ENCOUNTER — Other Ambulatory Visit: Payer: Self-pay

## 2018-11-11 DIAGNOSIS — Z9989 Dependence on other enabling machines and devices: Secondary | ICD-10-CM | POA: Diagnosis not present

## 2018-11-11 DIAGNOSIS — G4733 Obstructive sleep apnea (adult) (pediatric): Secondary | ICD-10-CM | POA: Diagnosis not present

## 2018-11-11 NOTE — Progress Notes (Signed)
Reviewed and agree with plan.

## 2018-11-11 NOTE — Patient Instructions (Addendum)
Treatment options continue on th bipap with the settings 5 to 20 support of 4.  Patient continues to benefit from BiPAP with good compliance and control documented Continue CPAP at current settings Continue current medications Goal of 4 hours or more usage per night Maintain healthy weight Do not drive if drowsy  Follow up with Dr. Wynona Neat in 4 months or sooner if needed

## 2018-11-11 NOTE — Assessment & Plan Note (Signed)
Patient has a follow-up visit today for follow-up on BiPAP.  He states that he has been on BiPAP for several years but recently got a new machine.  He states that he is doing well with his new machine and he has been compliant.  He states that he benefits from wearing his machine and feels much less drowsy during the day.  He denies any issues with his machine or mask.  He does not need any supplies at this time.  Patient Instructions  Treatment options continue on th bipap with the settings 5 to 20 support of 4.  Patient continues to benefit from BiPAP with good compliance and control documented Continue CPAP at current settings Continue current medications Goal of 4 hours or more usage per night Maintain healthy weight Do not drive if drowsy  Follow up with Dr. Wynona Neat in 4 months or sooner if needed

## 2018-11-11 NOTE — Progress Notes (Signed)
Virtual Visit via Telephone Note  I connected with Jordan Moody on 11/11/18 at  9:00 AM EDT by telephone and verified that I am speaking with the correct person using two identifiers.   I discussed the limitations, risks, security and privacy concerns of performing an evaluation and management service by telephone and the availability of in person appointments. I also discussed with the patient that there may be a patient responsible charge related to this service. The patient expressed understanding and agreed to proceed.   History of Present Illness: 53 year old male former smoker with severe sleep apnea who is followed by Dr. Wynona Neat.  Patient has a follow-up visit today for follow-up on BiPAP.  He states that he has been on BiPAP for several years but recently got a new machine.  He states that he is doing well with his new machine and he has been compliant.  He states that he benefits from wearing his machine and feels much less drowsy during the day.  He denies any issues with his machine or mask.  He does not need any supplies at this time. Denies f/c/s, n/v/d, hemoptysis, PND, leg swelling.    Observations/Objective:  HST 07/25/18 - Severe sleep apnea - AHI: 33.5/hour, Sats dropped to 74%  CPAP compliance report 10/12/18 - 11/10/18: usage days 30/30 (100%), average usage 6 hours 51 minutes, VAuto mode, max IPAP 20 CMh20, min EPAP 5 cm, pressure support 4 cmH20, AHI:1.1  Assessment and Plan: Patient has a follow-up visit today for follow-up on BiPAP.  He states that he has been on BiPAP for several years but recently got a new machine.  He states that he is doing well with his new machine and he has been compliant.  He states that he benefits from wearing his machine and feels much less drowsy during the day.  He denies any issues with his machine or mask.  He does not need any supplies at this time.  Patient Instructions  Treatment options continue on th bipap with the settings 5 to 20  support of 4.  Patient continues to benefit from BiPAP with good compliance and control documented Continue CPAP at current settings Continue current medications Goal of 4 hours or more usage per night Maintain healthy weight Do not drive if drowsy    Follow Up Instructions: Follow up with Dr. Wynona Neat in 4 months or sooner if needed    I discussed the assessment and treatment plan with the patient. The patient was provided an opportunity to ask questions and all were answered. The patient agreed with the plan and demonstrated an understanding of the instructions.   The patient was advised to call back or seek an in-person evaluation if the symptoms worsen or if the condition fails to improve as anticipated.  I provided 23 minutes of non-face-to-face time during this encounter.   Ivonne Andrew, NP

## 2018-12-18 ENCOUNTER — Other Ambulatory Visit: Payer: Self-pay | Admitting: Physician Assistant

## 2018-12-18 DIAGNOSIS — I10 Essential (primary) hypertension: Secondary | ICD-10-CM

## 2019-01-10 ENCOUNTER — Other Ambulatory Visit: Payer: Self-pay | Admitting: Physician Assistant

## 2019-01-10 DIAGNOSIS — I10 Essential (primary) hypertension: Secondary | ICD-10-CM

## 2019-01-27 ENCOUNTER — Other Ambulatory Visit: Payer: Self-pay | Admitting: Physician Assistant

## 2019-01-27 DIAGNOSIS — I10 Essential (primary) hypertension: Secondary | ICD-10-CM

## 2019-02-03 ENCOUNTER — Encounter: Payer: Self-pay | Admitting: Physician Assistant

## 2019-02-03 ENCOUNTER — Ambulatory Visit (INDEPENDENT_AMBULATORY_CARE_PROVIDER_SITE_OTHER): Payer: Managed Care, Other (non HMO) | Admitting: Physician Assistant

## 2019-02-03 VITALS — Temp 98.2°F | Wt 389.0 lb

## 2019-02-03 DIAGNOSIS — J4530 Mild persistent asthma, uncomplicated: Secondary | ICD-10-CM

## 2019-02-03 DIAGNOSIS — I872 Venous insufficiency (chronic) (peripheral): Secondary | ICD-10-CM

## 2019-02-03 DIAGNOSIS — E789 Disorder of lipoprotein metabolism, unspecified: Secondary | ICD-10-CM

## 2019-02-03 DIAGNOSIS — I10 Essential (primary) hypertension: Secondary | ICD-10-CM

## 2019-02-03 DIAGNOSIS — Z5181 Encounter for therapeutic drug level monitoring: Secondary | ICD-10-CM

## 2019-02-03 MED ORDER — ADVAIR DISKUS 100-50 MCG/DOSE IN AEPB: 1.0000 | INHALATION_SPRAY | Freq: Two times a day (BID) | RESPIRATORY_TRACT | 2 refills | Status: DC

## 2019-02-03 MED ORDER — OLMESARTAN MEDOXOMIL-HCTZ 40-25 MG PO TABS: 1.0000 | ORAL_TABLET | Freq: Every day | ORAL | 0 refills | Status: DC

## 2019-02-03 MED ORDER — ALBUTEROL SULFATE HFA 108 (90 BASE) MCG/ACT IN AERS: 1.0000 | INHALATION_SPRAY | RESPIRATORY_TRACT | 5 refills | Status: DC | PRN

## 2019-02-03 NOTE — Progress Notes (Signed)
Virtual Visit via Video Note  I connected with Jordan MellowBrian L Medico on 02/03/19 at  2:20 PM EDT by a video enabled telemedicine application and verified that I am speaking with the correct person using two identifiers.   I discussed the limitations of evaluation and management by telemedicine and the availability of in person appointments. The patient expressed understanding and agreed to proceed.  History of Present Illness: HPI:                                                                Jordan Moody is a 53 y.o. male   CC: medication refills/chronic disease mgmt  HTN: taking Benicar daily. Compliant with medications. Does not check BP's at home. Reports he has been exercising regularly and has lost 27 lb since his last office visit 6 months ago. He has chronic edema due to venous insufficiency, but this is well controlled with compression and HCTZ. Denies vision change, headache, chest pain with exertion, orthopnea, lightheadedness, syncope. Risk factors include: obesity, male sex, family history of CAD  Asthma: well-controlled on Advair. Has not used rescue inhaler in several years. No exacerbations/steroids in the last year.  Hx of venous stasis ulcer: treated with unna boot 05/01/18. Reports he has not had any recurrent symptoms since that time. Wearing compression socks daily.  OSA: followed by Pulmonology. Compliant with BiPAP  [facility]  Decreased Interest 0  Down, Depressed, Hopeless 0  PHQ - 2 Score 0    No flowsheet data found.    Past Medical History:  Diagnosis Date  . Allergy   . Asthma   . Borderline high cholesterol 09/04/2017   10-yr ASCVD risk 4.9%  . Colon polyps   . Former smoker   . Hypertension   . Obesity   . OSA (obstructive sleep apnea)    Past Surgical History:  Procedure Laterality Date  . COLONOSCOPY W/ POLYPECTOMY    . KNEE ARTHROSCOPY Right    Social History   Tobacco Use  . Smoking status: Former Smoker   Packs/day: 1.00    Years: 28.00    Pack years: 28.00    Types: Cigarettes    Quit date: 05/19/2013    Years since quitting: 5.7  . Smokeless tobacco: Never Used  Substance Use Topics  . Alcohol use: Yes    Alcohol/week: 6.0 standard drinks    Types: 6 Standard drinks or equivalent per week   family history includes Colon cancer in his paternal grandfather; Heart attack in his maternal grandfather and paternal grandfather; Hyperlipidemia in his father; Hypertension in his mother; Stroke in his paternal grandfather.    ROS: negative except as noted in the HPI  Medications: Current Outpatient Medications  Medication Sig Dispense Refill  . ADVAIR DISKUS 100-50 MCG/DOSE AEPB Inhale 1 puff into the lungs 2 (two) times daily. 180 each 2  . aspirin EC 81 MG tablet Take 1 tablet (81 mg total) by mouth daily. 90 tablet 3  . Elastic Bandages & Supports (MEDICAL COMPRESSION SOCKS) MISC L-XL circ., knee high, medium compression Wear daily 2 each 0  . olmesartan-hydrochlorothiazide (BENICAR HCT) 40-25 MG tablet TAKE 1 TABLET BY MOUTH DAILY. DUE FOR FOLLOW UP VISIT W/PCP 15 tablet 0   No current facility-administered medications  for this visit.    No Known Allergies     Objective:  Temp 98.2 F (36.8 C) (Tympanic)   Wt (!) 389 lb (176.4 kg)   BMI 51.32 kg/m  Gen:  alert, not ill-appearing, no distress, appropriate for age HEENT: head normocephalic without obvious abnormality, conjunctiva and cornea clear, trachea midline Pulm: Normal work of breathing, normal phonation Neuro: alert and oriented x 3 Psych: cooperative, euthymic mood, affect mood-congruent, speech is articulate, normal rate and volume; thought processes clear and goal-directed, normal judgment, good insight     BP Readings from Last 3 Encounters:  07/08/18 130/78  06/06/18 130/79  05/14/18 (!) 142/86   Pulse Readings from Last 3 Encounters:  07/08/18 71  06/06/18 75  05/14/18 85   Wt Readings from Last 3  Encounters:  02/03/19 (!) 389 lb (176.4 kg)  07/08/18 (!) 416 lb (188.7 kg)  06/06/18 (!) 425 lb 1.9 oz (192.8 kg)   Lab Results  Component Value Date   CREATININE 0.71 09/03/2017   BUN 15 09/03/2017   NA 137 09/03/2017   K 4.2 09/03/2017   CL 101 09/03/2017   CO2 28 09/03/2017   Lab Results  Component Value Date   ALT 28 09/03/2017   AST 17 09/03/2017   BILITOT 0.4 09/03/2017   Lab Results  Component Value Date   WBC 5.5 09/03/2017   HGB 14.1 09/03/2017   HCT 40.9 09/03/2017   MCV 87.2 09/03/2017   PLT 273 09/03/2017   Lab Results  Component Value Date   CHOL 195 09/03/2017   HDL 58 09/03/2017   LDLCALC 112 (H) 09/03/2017   TRIG 132 09/03/2017   CHOLHDL 3.4 09/03/2017   The 10-year ASCVD risk score Denman George(Goff DC Jr., et al., 2013) is: 4.2%   Values used to calculate the score:     Age: 89 years     Sex: Male     Is Non-Hispanic African American: No     Diabetic: No     Tobacco smoker: No     Systolic Blood Pressure: 130 mmHg     Is BP treated: Yes     HDL Cholesterol: 58 mg/dL     Total Cholesterol: 195 mg/dL   Assessment and Plan: 53 y.o. male with   .Arlys Moody was seen today for medication management.  Diagnoses and all orders for this visit:  Venous insufficiency of both lower extremities  Hypertension goal BP (blood pressure) < 130/80 -     olmesartan-hydrochlorothiazide (BENICAR HCT) 40-25 MG tablet; Take 1 tablet by mouth daily. Due for labs -     CBC -     COMPLETE METABOLIC PANEL WITH GFR  Mild persistent asthma without complication -     ADVAIR DISKUS 100-50 MCG/DOSE AEPB; Inhale 1 puff into the lungs 2 (two) times daily. -     albuterol (VENTOLIN HFA) 108 (90 Base) MCG/ACT inhaler; Inhale 1-2 puffs into the lungs every 4 (four) hours as needed for wheezing or shortness of breath.  Borderline high cholesterol -     Lipid Panel w/reflex Direct LDL  Medication monitoring encounter -     CBC -     COMPLETE METABOLIC PANEL WITH GFR -     Lipid  Panel w/reflex Direct LDL   Due for routine fasting labs 30 day supply of antihypertensive medication sent to pharmacy. No additional refills until labs resulted  Reviewed health maintenance Colonoscopy UTD per patient, requesting record from [facility] in [place]  Follow-up in office  in 6 months or sooner  Follow Up Instructions:    I discussed the assessment and treatment plan with the patient. The patient was provided an opportunity to ask questions and all were answered. The patient agreed with the plan and demonstrated an understanding of the instructions.   The patient was advised to call back or seek an in-person evaluation if the symptoms worsen or if the condition fails to improve as anticipated.  I provided approx 10 minutes of non-face-to-face time during this encounter.   Trixie Dredge, [place]

## 2019-02-05 LAB — COMPLETE METABOLIC PANEL WITH GFR
AG Ratio: 1.5 (calc) (ref 1.0–2.5)
ALT: 27 U/L (ref 9–46)
AST: 22 U/L (ref 10–35)
Albumin: 4.4 g/dL (ref 3.6–5.1)
Alkaline phosphatase (APISO): 50 U/L (ref 35–144)
BUN: 15 mg/dL (ref 7–25)
CO2: 28 mmol/L (ref 20–32)
Calcium: 9.6 mg/dL (ref 8.6–10.3)
Chloride: 99 mmol/L (ref 98–110)
Creat: 0.86 mg/dL (ref 0.70–1.33)
GFR, Est African American: 116 mL/min/{1.73_m2} (ref >=60)
GFR, Est Non African American: 100 mL/min/{1.73_m2} (ref >=60)
Globulin: 3 g/dL (calc) (ref 1.9–3.7)
Glucose, Bld: 97 mg/dL (ref 65–99)
Potassium: 4.3 mmol/L (ref 3.5–5.3)
Sodium: 137 mmol/L (ref 135–146)
Total Bilirubin: 0.6 mg/dL (ref 0.2–1.2)
Total Protein: 7.4 g/dL (ref 6.1–8.1)

## 2019-02-05 LAB — CBC
HCT: 41.9 % (ref 38.5–50.0)
Hemoglobin: 13.9 g/dL (ref 13.2–17.1)
MCH: 30 pg (ref 27.0–33.0)
MCHC: 33.2 g/dL (ref 32.0–36.0)
MCV: 90.3 fL (ref 80.0–100.0)
MPV: 11.4 fL (ref 7.5–12.5)
Platelets: 263 10*3/uL (ref 140–400)
RBC: 4.64 10*6/uL (ref 4.20–5.80)
RDW: 12.7 % (ref 11.0–15.0)
WBC: 5 10*3/uL (ref 3.8–10.8)

## 2019-02-05 LAB — LIPID PANEL W/REFLEX DIRECT LDL
Cholesterol: 196 mg/dL (ref <200)
HDL: 45 mg/dL (ref >=40)
LDL Cholesterol (Calc): 127 mg/dL (calc) — ABNORMAL HIGH
Non-HDL Cholesterol (Calc): 151 mg/dL (calc) — ABNORMAL HIGH (ref <130)
Total CHOL/HDL Ratio: 4.4 (calc) (ref <5.0)
Triglycerides: 127 mg/dL (ref <150)

## 2019-02-25 ENCOUNTER — Encounter: Payer: Self-pay | Admitting: Physician Assistant

## 2019-03-03 ENCOUNTER — Encounter: Payer: Self-pay | Admitting: Physician Assistant

## 2019-03-03 ENCOUNTER — Other Ambulatory Visit: Payer: Self-pay | Admitting: Physician Assistant

## 2019-03-03 DIAGNOSIS — I10 Essential (primary) hypertension: Secondary | ICD-10-CM

## 2019-04-21 ENCOUNTER — Ambulatory Visit (INDEPENDENT_AMBULATORY_CARE_PROVIDER_SITE_OTHER): Payer: Managed Care, Other (non HMO) | Admitting: Family Medicine

## 2019-04-21 ENCOUNTER — Encounter: Payer: Self-pay | Admitting: Family Medicine

## 2019-04-21 ENCOUNTER — Other Ambulatory Visit: Payer: Self-pay

## 2019-04-21 VITALS — BP 142/69 | HR 95 | Temp 98.1°F | Wt >= 6400 oz

## 2019-04-21 DIAGNOSIS — Z23 Encounter for immunization: Secondary | ICD-10-CM

## 2019-04-21 DIAGNOSIS — M62838 Other muscle spasm: Secondary | ICD-10-CM | POA: Diagnosis not present

## 2019-04-21 MED ORDER — CYCLOBENZAPRINE HCL 10 MG PO TABS: 10.0000 mg | ORAL_TABLET | Freq: Three times a day (TID) | ORAL | 0 refills | Status: DC | PRN

## 2019-04-21 NOTE — Progress Notes (Signed)
Jordan Moody is a 53 y.o. male who presents to [place] today for pain in the right lateral neck and trapezius.  Symptoms present for about 10 days without injury.  He notes pain does not radiate down the arm.  Pain is not worse with shoulder motion.  Pain is worse with neck motion.  Symptoms are moderate.  He is tried Tylenol and ibuprofen with little benefit.  No weakness or numbness distally.  No fevers chills nausea vomiting diarrhea.  Pain is not consistent previous episodes of rotator cuff pathology or pain.   Additionally patient would like a flu vaccine today if possible. ROS:  As above  Exam:  BP (!) 142/69   Pulse 95   Temp 98.1 F (36.7 C) (Oral)   Wt (!) 427 lb (193.7 kg)   BMI 56.34 kg/m  Wt Readings from Last 5 Encounters:  04/21/19 (!) 427 lb (193.7 kg)  02/03/19 (!) 389 lb (176.4 kg)  07/08/18 (!) 416 lb (188.7 kg)  06/06/18 (!) 425 lb 1.9 oz (192.8 kg)  05/14/18 (!) 425 lb (192.8 kg)   General: Well Developed, well nourished, and in no acute distress.  Neuro/Psych: Alert and oriented x3, extra-ocular muscles intact, able to move all 4 extremities, sensation grossly intact. Skin: Warm and dry, no rashes noted.  Respiratory: Not using accessory muscles, speaking in full sentences, trachea midline.  Cardiovascular: Pulses palpable, no extremity edema. Abdomen: Does not appear distended. MSK:  C-spine: Nontender to spinal midline.  Tender palpation right cervical paraspinal musculature and trapezius. Normal cervical motion however pain with right lateral flexion and rotation. Upper extremity strength reflexes and sensation are equal normal throughout.   Pulses cap refill sensation intact distally.  Normal shoulder motion bilaterally.  Normal strength negative impingement testing bilateral shoulders.     Assessment and Plan: 53 y.o. male with right lateral neck pain.  Very likely cervical muscle spasm and strain.   Discussed options.  Plan for home exercise program, heating pad, TENS unit, cyclobenzaprine and NSAIDs.  Additionally refer to physical therapy.  Recheck if not improving.  Precautions reviewed.  Influenza vaccine given today prior to discharge.    PDMP not reviewed this encounter. Orders Placed This Encounter  Procedures  . Flu Vaccine QUAD 6+ mos PF IM (Fluarix Quad PF)  . Ambulatory referral to Physical Therapy    Referral Priority:   Routine    Referral Type:   Physical Medicine    Referral Reason:   Specialty Services Required    Requested Specialty:   Physical Therapy   Meds ordered this encounter  Medications  . cyclobenzaprine (FLEXERIL) 10 MG tablet    Sig: Take 1 tablet (10 mg total) by mouth 3 (three) times daily as needed for muscle spasms.    Dispense:  30 tablet    Refill:  0    Historical information moved to improve visibility of documentation.  Past Medical History:  Diagnosis Date  . Allergy   . Asthma   . Borderline high cholesterol 09/04/2017   10-yr ASCVD risk 4.9%  . Colon polyps   . Former smoker   . Hypertension   . Obesity   . OSA (obstructive sleep apnea)   . Venous insufficiency of both lower extremities 05/01/2018  . Venous ulcer, limited to breakdown of skin ([place]) 05/01/2018   Past Surgical History:  Procedure Laterality Date  . COLONOSCOPY W/ POLYPECTOMY    . KNEE ARTHROSCOPY Right    Social  History   Tobacco Use  . Smoking status: Former Smoker    Packs/day: 1.00    Years: 28.00    Pack years: 28.00    Types: Cigarettes    Quit date: 05/19/2013    Years since quitting: 5.9  . Smokeless tobacco: Never Used  Substance Use Topics  . Alcohol use: Yes    Alcohol/week: 6.0 standard drinks    Types: 6 Standard drinks or equivalent per week   family history includes Colon cancer in his paternal grandfather; Heart attack in his maternal grandfather and paternal grandfather; Hyperlipidemia in his father; Hypertension in his mother; Stroke  in his paternal grandfather.  Medications: Current Outpatient Medications  Medication Sig Dispense Refill  . ADVAIR DISKUS 100-50 MCG/DOSE AEPB Inhale 1 puff into the lungs 2 (two) times daily. 180 each 2  . albuterol (VENTOLIN HFA) 108 (90 Base) MCG/ACT inhaler Inhale 1-2 puffs into the lungs every 4 (four) hours as needed for wheezing or shortness of breath. 1 Inhaler 5  . aspirin EC 81 MG tablet Take 1 tablet (81 mg total) by mouth daily. 90 tablet 3  . cyclobenzaprine (FLEXERIL) 10 MG tablet Take 1 tablet (10 mg total) by mouth 3 (three) times daily as needed for muscle spasms. 30 tablet 0  . Elastic Bandages & Supports (MEDICAL COMPRESSION SOCKS) MISC L-XL circ., knee high, medium compression Wear daily 2 each 0  . olmesartan-hydrochlorothiazide (BENICAR HCT) 40-25 MG tablet Take 1 tablet by mouth daily. Due for labs 90 tablet 0   No current facility-administered medications for this visit.    No Known Allergies    Discussed warning signs or symptoms. Please see discharge instructions. Patient expresses understanding.

## 2019-04-21 NOTE — Patient Instructions (Signed)
Thank you for coming in today. Come back or go to the emergency room if you notice new weakness new numbness problems walking or bowel or bladder problems.  TENS UNIT: This is helpful for muscle pain and spasm.   Search and Purchase a TENS 7000 2nd edition at  www.tenspros.com or www.[place].com It should be less than $30.     TENS unit instructions: Do not shower or bathe with the unit on Turn the unit off before removing electrodes or batteries If the electrodes lose stickiness add a drop of water to the electrodes after they are disconnected from the unit and place on plastic sheet. If you continued to have difficulty, call the TENS unit company to purchase more electrodes. Do not apply lotion on the skin area prior to use. Make sure the skin is clean and dry as this will help prolong the life of the electrodes. After use, always check skin for unusual red areas, rash or other skin difficulties. If there are any skin problems, does not apply electrodes to the same area. Never remove the electrodes from the unit by pulling the wires. Do not use the TENS unit or electrodes other than as directed. Do not change electrode placement without consultating your therapist or physician. Keep 2 fingers with between each electrode. Wear time ratio is 2:1, on to off times.    For example on for 30 minutes off for 15 minutes and then on for 30 minutes off for 15 minutes

## 2019-05-06 ENCOUNTER — Ambulatory Visit (INDEPENDENT_AMBULATORY_CARE_PROVIDER_SITE_OTHER): Payer: Managed Care, Other (non HMO) | Admitting: Family Medicine

## 2019-05-06 ENCOUNTER — Ambulatory Visit (INDEPENDENT_AMBULATORY_CARE_PROVIDER_SITE_OTHER): Payer: Managed Care, Other (non HMO)

## 2019-05-06 ENCOUNTER — Other Ambulatory Visit: Payer: Self-pay

## 2019-05-06 VITALS — BP 136/74 | HR 81 | Wt >= 6400 oz

## 2019-05-06 DIAGNOSIS — M25511 Pain in right shoulder: Secondary | ICD-10-CM | POA: Diagnosis not present

## 2019-05-06 NOTE — Patient Instructions (Signed)
Thank you for coming in today. Get xray  Attend PT.  Recheck as needed.  Call or go to the ER if you develop a large red swollen joint with extreme pain or oozing puss.

## 2019-05-06 NOTE — Progress Notes (Signed)
Jordan Moody is a 53 y.o. male who presents to [facility] today for neck and shoulder pain. Pain began 3 weeks ago primarily on the posterior side of the neck. Patient used TENS device, heat, and ice which relieved the pain. Patient has had on and off right shoulder pain for years, pain was more prominent a month ago and then worsened one week ago. Pain is located on the posterior and lateral side of the right shoulder. Patient rates the pain an 8/10 and states its the worst its ever been. Ice temporarily provides relief. Ibuprofen does not provide any relief. Exercise/activity aggravates the pain. Patient thinks the pain is different from a previous rotator cuff injury. Patient denies other joint pains, muscle aches, swelling, rashes, history of trauma or injury.   ROS:  As above  Exam:  BP 136/74   Pulse 81   Wt (!) 421 lb (191 kg)   SpO2 98%   BMI 55.54 kg/m  Wt Readings from Last 5 Encounters:  05/06/19 (!) 421 lb (191 kg)  04/21/19 (!) 427 lb (193.7 kg)  02/03/19 (!) 389 lb (176.4 kg)  07/08/18 (!) 416 lb (188.7 kg)  06/06/18 (!) 425 lb 1.9 oz (192.8 kg)   General: Well Developed, well nourished, and in no acute distress.  Neuro/Psych: Alert and oriented x3, extra-ocular muscles intact, able to move all 4 extremities, sensation grossly intact. Skin: Warm and dry, no rashes noted.  Respiratory: Not using accessory muscles, speaking in full sentences, trachea midline.  Cardiovascular: Pulses palpable, no extremity edema. Abdomen: Does not appear distended. MSK:  C-spine: Nontender to spinal midline normal cervical motion. Mildly tender palpation right trapezius. Right shoulder: Normal-appearing Normal range of motion.  No significant pain with abduction.  But however patient does have pain with internal rotation. Intact strength to abduction external and internal rotation. Mildly positive empty can test.  Minimally positive Hawkins  and Neer's test. Negative Yergason's and speeds test. Mild crepitation with shoulder motion.  Negative clunk test.   Lab and Radiology Results X-ray images right shoulder personally independently reviewed. Mild degeneration AC joint.  No severe DJD glenohumeral joint.  No fracture visible. Await formal radiology review.  Procedure: Real-time Ultrasound Guided Injection of right glenohumeral joint Device: GE Logiq E   Images permanently stored and available for review in the ultrasound unit. Verbal informed consent obtained.  Discussed risks and benefits of procedure. Warned about infection bleeding damage to structures skin hypopigmentation and fat atrophy among others. Patient expresses understanding and agreement Time-out conducted.   Noted no overlying erythema, induration, or other signs of local infection.   Skin prepped in a sterile fashion.   Local anesthesia: Topical Ethyl chloride.   With sterile technique and under real time ultrasound guidance:  40 mg of Depo-Medrol and 2 mL of Marcaine injected easily.   Completed without difficulty   Pain immediately resolved suggesting accurate placement of the medication.   Advised to call if fevers/chills, erythema, induration, drainage, or persistent bleeding.   Images permanently stored and available for review in the ultrasound unit.  Impression: Technically successful ultrasound guided injection.         Assessment and Plan: 53 y.o. male with 3 week history of neck pain and 1 week history of shoulder pain. Administered right glenohumeral steroid injection in office.  X-ray does not show severe degeneration glenohumeral joint.  Patient did have significant benefit with humeral injection indicating possible interarticular cause.  Plan for trial  of physical therapy and recheck if not improving in near future.    PDMP not reviewed this encounter. Orders Placed This Encounter  Procedures  . DG Shoulder Right    Standing  Status:   Future    Standing Expiration Date:   07/05/2020    Order Specific Question:   Reason for Exam (SYMPTOM  OR DIAGNOSIS REQUIRED)    Answer:   right shoulder pain    Order Specific Question:   Preferred imaging location?    Answer:   [facility]    Order Specific Question:   Radiology Contrast Protocol - do NOT remove file path    Answer:   [email or web address]  . Ambulatory referral to Physical Therapy    Referral Priority:   Routine    Referral Type:   Physical Medicine    Referral Reason:   Specialty Services Required    Requested Specialty:   Physical Therapy   No orders of the defined types were placed in this encounter.   Historical information moved to improve visibility of documentation.  Past Medical History:  Diagnosis Date  . Allergy   . Asthma   . Borderline high cholesterol 09/04/2017   10-yr ASCVD risk 4.9%  . Colon polyps   . Former smoker   . Hypertension   . Obesity   . OSA (obstructive sleep apnea)   . Venous insufficiency of both lower extremities 05/01/2018  . Venous ulcer, limited to breakdown of skin ([facility]) 05/01/2018   Past Surgical History:  Procedure Laterality Date  . COLONOSCOPY W/ POLYPECTOMY    . KNEE ARTHROSCOPY Right    Social History   Tobacco Use  . Smoking status: Former Smoker    Packs/day: 1.00    Years: 28.00    Pack years: 28.00    Types: Cigarettes    Quit date: 05/19/2013    Years since quitting: 5.9  . Smokeless tobacco: Never Used  Substance Use Topics  . Alcohol use: Yes    Alcohol/week: 6.0 standard drinks    Types: 6 Standard drinks or equivalent per week   family history includes Colon cancer in his paternal grandfather; Heart attack in his maternal grandfather and paternal grandfather; Hyperlipidemia in his father; Hypertension in his mother; Stroke in his paternal grandfather.  Medications: Current Outpatient Medications  Medication Sig Dispense Refill  .  ADVAIR DISKUS 100-50 MCG/DOSE AEPB Inhale 1 puff into the lungs 2 (two) times daily. 180 each 2  . albuterol (VENTOLIN HFA) 108 (90 Base) MCG/ACT inhaler Inhale 1-2 puffs into the lungs every 4 (four) hours as needed for wheezing or shortness of breath. 1 Inhaler 5  . aspirin EC 81 MG tablet Take 1 tablet (81 mg total) by mouth daily. 90 tablet 3  . cyclobenzaprine (FLEXERIL) 10 MG tablet Take 1 tablet (10 mg total) by mouth 3 (three) times daily as needed for muscle spasms. 30 tablet 0  . Elastic Bandages & Supports (MEDICAL COMPRESSION SOCKS) MISC L-XL circ., knee high, medium compression Wear daily 2 each 0  . olmesartan-hydrochlorothiazide (BENICAR HCT) 40-25 MG tablet Take 1 tablet by mouth daily. Due for labs 90 tablet 0   No current facility-administered medications for this visit.    No Known Allergies    Discussed warning signs or symptoms. Please see discharge instructions. Patient expresses understanding.  I personally was present and performed or re-performed the history, physical exam and medical decision-making activities of this service and have verified that the service and findings are accurately documented  in the student's note. ___________________________________________ Clementeen GrahamEvan Aalaysia Liggins M.D., ABFM., CAQSM. Primary Care and Sports Medicine Adjunct Instructor of Family Medicine  [employer]

## 2019-05-07 ENCOUNTER — Telehealth: Payer: Self-pay | Admitting: Pulmonary Disease

## 2019-05-07 NOTE — Telephone Encounter (Signed)
Spoke with pt, he states Jordan Moody said they never received payment from [place] because of non-compliance. I checked his download and he seems to be in compliance. I advised pt that I would call [employer] on Monday and try to figure what's going on. He may need an office visit since he hasnt been seen since 06/2018. Will hold in triage until Monday.

## 2019-05-10 NOTE — Telephone Encounter (Signed)
Called [employer], as it is unclear to me why there would be a payment denial d/t noncompliance if there is documented compliance. Spoke with Magda Paganini, who states that his account states "guidelines not met" despite pt being compliant with bipap.  Magda Paganini is going to look further into this to see what is needed, and will call our office back with what is needed once she establishes this.  Will keep encounter open to follow up on.

## 2019-05-12 NOTE — Telephone Encounter (Signed)
Spoke with Magda Paganini at [facility]. She states that all of this has been addressed and fixed. Pt is aware of this and has a zero balance on his account. Nothing further was needed.

## 2019-05-13 ENCOUNTER — Ambulatory Visit (INDEPENDENT_AMBULATORY_CARE_PROVIDER_SITE_OTHER): Payer: Managed Care, Other (non HMO) | Admitting: Rehabilitative and Restorative Service Providers"

## 2019-05-13 ENCOUNTER — Encounter: Payer: Self-pay | Admitting: Rehabilitative and Restorative Service Providers"

## 2019-05-13 ENCOUNTER — Other Ambulatory Visit: Payer: Self-pay

## 2019-05-13 DIAGNOSIS — R293 Abnormal posture: Secondary | ICD-10-CM | POA: Diagnosis not present

## 2019-05-13 DIAGNOSIS — M25511 Pain in right shoulder: Secondary | ICD-10-CM | POA: Diagnosis not present

## 2019-05-13 DIAGNOSIS — R29898 Other symptoms and signs involving the musculoskeletal system: Secondary | ICD-10-CM

## 2019-05-13 DIAGNOSIS — M542 Cervicalgia: Secondary | ICD-10-CM | POA: Diagnosis not present

## 2019-05-13 DIAGNOSIS — M6281 Muscle weakness (generalized): Secondary | ICD-10-CM

## 2019-05-13 NOTE — Therapy (Signed)
[facility] [place] [address] [place], [place], [address] Phone: [phone]   Fax:  [phone]  Physical Therapy Evaluation  Patient Details  Name: Jordan Moody MRN: [id number] Date of Birth: [birth year] Referring Provider (PT): Dr Clementeen Graham    Encounter Date: 05/13/2019  PT End of Session - 05/13/19 1543    Visit Number  1    Number of Visits  12    Date for PT Re-Evaluation  06/24/19    PT Start Time  1445    PT Stop Time  1538    PT Time Calculation (min)  53 min    Activity Tolerance  Patient tolerated treatment well       Past Medical History:  Diagnosis Date  . Allergy   . Asthma   . Borderline high cholesterol 09/04/2017   10-yr ASCVD risk 4.9%  . Colon polyps   . Former smoker   . Hypertension   . Obesity   . OSA (obstructive sleep apnea)   . Venous insufficiency of both lower extremities 05/01/2018  . Venous ulcer, limited to breakdown of skin (HCC) 05/01/2018    Past Surgical History:  Procedure Laterality Date  . COLONOSCOPY W/ POLYPECTOMY    . KNEE ARTHROSCOPY Right     There were no vitals filed for this visit.   Subjective Assessment - 05/13/19 1451    Subjective  Patient report Rt shoulder over the past 15 years with significant increase in pain in the past two months. Neck pain for the past month. No known injury. symptoms have gradually increased. He received injection in Rt shoulder 05/06/2019.    Pertinent History  HTN; Rt knee sugery at 53 yr old; arthritis; obesity; asthma; Rt shoulder pain for ~ 15 yrs    Diagnostic tests  xrays    Patient Stated Goals  get rid of the pain in the neck and shoulder; get active again    Currently in Pain?  Yes    Pain Location  Neck    Pain Orientation  Right    Pain Descriptors / Indicators  Dull;Aching    Pain Type  Acute pain;Chronic pain    Pain Radiating Towards  neck to shoulder - into the Rt forearm and elbow today - not typical    Pain Onset   More than a month ago    Pain Frequency  Intermittent    Aggravating Factors   lying down; sitting for prolonged periods; staying in one position for too long; driving    Pain Relieving Factors  injection; ice; maybe TENS(helps neck - not as much for shoulder)         [facility] PT Assessment - 05/13/19 0001      Assessment   Medical Diagnosis  Rt cervical and shoulder pain     Referring Provider (PT)  Dr Clementeen Graham     Onset Date/Surgical Date  02/23/19   pain in the shoulder for 15 yrs    Hand Dominance  Right    Next MD Visit  PRN     Prior Therapy  for knee; maybe shoulder       Precautions   Precautions  None      Balance Screen   Has the patient fallen in the past 6 months  Yes    How many times?  1   getting out of the shower falling into the wall toward Rt    Has the patient had a decrease in activity  level because of a fear of falling?   No    Is the patient reluctant to leave their home because of a fear of falling?   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 yrs     Leisure  yard work; Systems analyst; fish; travel    in gym aerobic and wts/classes prior to covid      Observation/Other Assessments   Focus on Therapeutic Outcomes (FOTO)   24% limitation       Sensation   Additional Comments  WFL's per pt report       Posture/Postural Control   Posture Comments  head forward; shoulders rounded and elevated; head of the humerus anterior; arms crossed over chest and forward; scapulae abducted and rotated along the thoracic spine       AROM   Right Shoulder Extension  53 Degrees   pain    Right Shoulder Flexion  142 Degrees   discomfort    Right Shoulder ABduction  143 Degrees   discomfort    Right Shoulder Internal Rotation  45 Degrees   pain    Right Shoulder External Rotation  87 Degrees    Left Shoulder  Extension  50 Degrees    Left Shoulder Flexion  149 Degrees    Left Shoulder ABduction  153 Degrees    Left Shoulder Internal Rotation  45 Degrees    Left Shoulder External Rotation  90 Degrees    Cervical Flexion  62    Cervical Extension  28 pain     Cervical - Right Side Bend  28 pain Rt cervical area     Cervical - Left Side Bend  28 pulling     Cervical - Right Rotation  48 pain Rt cervical     Cervical - Left Rotation  64      Strength   Overall Strength Comments  middle trap 5-/5 Rt; lower trap 4/5 Rt       Palpation   Palpation comment  muscular tightness Rt > Lt lateral cervical musculature; pecs; upper trap; teres                 Objective measurements completed on examination: See above findings.      [facility] Adult PT Treatment/Exercise - 05/13/19 0001      Therapeutic Activites    Therapeutic Activities  --   myofacial ball release work; noodle for sitting posture     Neuro Re-ed    Neuro Re-ed Details   initiated neuromuscular re-education working on posture and alignment with posterior shoulder girdle engaged       Shoulder Exercises: Standing   Other Standing Exercises  axial extension 10 sec x 5; scap squeeze 10 sec x 5; L's x 10       Shoulder Exercises: Stretch   Other Shoulder Stretches  doorway stretch lower two positions 20 sec x 2 reps              PT Education - 05/13/19 1542    Education Details  HEP POC myofacial ball release    Person(s) Educated  Patient    Methods  Explanation;Demonstration;Tactile cues;Verbal cues;Handout    Comprehension  Verbalized understanding;Returned demonstration;Verbal cues required;Tactile cues required  PT Long Term Goals - 05/13/19 1553      PT LONG TERM GOAL #1   Title  Improve postue and alignment with patient to demonstrate improve upright posture with posterior shoulder girdle engaged    Time  6    Period  Weeks    Status  New    Target Date  06/24/19      PT LONG TERM GOAL  #2   Title  Increase cervical ROM to WFL's and painfree in all planes of motion    Time  6    Period  Weeks    Status  New    Target Date  06/24/19      PT LONG TERM GOAL #3   Title  Patient reports pain free Rt shoulder/UE function for all ADL's    Time  6    Period  Weeks    Status  New    Target Date  06/24/19      PT LONG TERM GOAL #4   Title  Maintain to improve 24% limitation score for FOTO    Time  6    Period  Weeks    Status  New    Target Date  06/24/19      PT LONG TERM GOAL #5   Title  Independent in HEP    Time  6    Period  Weeks    Status  New    Target Date  06/24/19             Plan - 05/13/19 1548    Clinical Impression Statement  Patient presents with ~ 2 month flare up of Rt cervical and shoulder pain. He has a history of intermittent Rt shoulder pain for the past 15 years. Patient has poor posture and alignment; limited cervical and shoulder ROM/mobility; poor scapular strength and stability; muscular tightness to palpation; pain on a daily basis; limitd functional activity level. Patient will benefit from PT to address problems identifies.    Personal Factors and Comorbidities  Comorbidity 1    Comorbidities  obesity; HTN    Stability/Clinical Decision Making  Stable/Uncomplicated    Clinical Decision Making  Low    Rehab Potential  Good    PT Frequency  2x / week    PT Duration  6 weeks    PT Treatment/Interventions  Patient/family education;ADLs/Self Care Home Management;Cryotherapy;Electrical Stimulation;Iontophoresis 4mg /ml Dexamethasone;Moist Heat;Ultrasound;Traction;Manual techniques;Dry needling;Neuromuscular re-education;Functional mobility training;Therapeutic activities;Therapeutic exercise    PT Next Visit Plan  review HEP; neuromuscular re-education working on cervical and shoulder girdle posture and alignment; stretch pecs; strengthening posterior shoulder girdle musculature; DN vs manual work areas of tightness Rt cervical and  shoulder; modalities as indicated(has TESN unit for home)    PT Home Exercise Plan  [id number]       Patient will benefit from skilled therapeutic intervention in order to improve the following deficits and impairments:  Pain, Increased fascial restricitons, Increased muscle spasms, Decreased strength, Decreased mobility, Decreased range of motion, Decreased activity tolerance  Visit Diagnosis: Cervicalgia - Plan: PT plan of care cert/re-cert  Acute pain of right shoulder - Plan: PT plan of care cert/re-cert  Other symptoms and signs involving the musculoskeletal system - Plan: PT plan of care cert/re-cert  Abnormal posture - Plan: PT plan of care cert/re-cert  Muscle weakness (generalized) - Plan: PT plan of care cert/re-cert     Problem List Patient Active Problem List   Diagnosis Date Noted  . Venous insufficiency of both lower  extremities 05/01/2018  . Borderline high cholesterol 09/04/2017  . Former heavy tobacco smoker 09/03/2017  . Class 3 severe obesity due to excess calories with serious comorbidity and body mass index (BMI) of 50.0 to 59.9 in adult ([place]) 09/03/2017  . Mild persistent asthma without complication 97/74/1423  . Allergic state 08/04/2014  . Uncontrolled stage 2 hypertension 08/04/2014  . OSA on CPAP 08/04/2014    Seibert Keeter Nilda Simmer PT,MPH  05/13/2019, 3:58 PM  [facility] [place], [place], [address] Phone: [phone]   Fax:  [phone]  Name: KOLBEY TEICHERT MRN: [id number] Date of Birth: August 30, 1965

## 2019-05-13 NOTE — Patient Instructions (Signed)
Access Code: [email or web address]  URL: [email or web address]  Date: 05/13/2019  Prepared by: Gillermo Murdoch   Exercises  Seated Cervical Retraction - 10 reps - 1 sets - 3x daily - 7x weekly  Standing Scapular Retraction - 10 reps - 1 sets - 10 hold - 3x daily - 7x weekly  Shoulder External Rotation and Scapular Retraction - 10 reps - 1 sets - hold - 3x daily - 7x weekly  Doorway Pec Stretch at 60 Degrees Abduction - 3 reps - 1 sets - 3x daily - 7x weekly  Doorway Pec Stretch at 90 Degrees Abduction - 3 reps - 1 sets - 30 seconds hold - 3x daily - 7x weekly  Doorway Pec Stretch at 120 Degrees Abduction - 3 reps - 1 sets - 30 second hold hold - 3x daily - 7x weekly  Patient Education  Trigger Point Dry Needling

## 2019-05-18 ENCOUNTER — Other Ambulatory Visit: Payer: Self-pay

## 2019-05-18 ENCOUNTER — Ambulatory Visit (INDEPENDENT_AMBULATORY_CARE_PROVIDER_SITE_OTHER): Payer: Managed Care, Other (non HMO) | Admitting: Physical Therapy

## 2019-05-18 ENCOUNTER — Encounter: Payer: Self-pay | Admitting: Physical Therapy

## 2019-05-18 DIAGNOSIS — M25511 Pain in right shoulder: Secondary | ICD-10-CM

## 2019-05-18 DIAGNOSIS — M542 Cervicalgia: Secondary | ICD-10-CM

## 2019-05-18 DIAGNOSIS — R293 Abnormal posture: Secondary | ICD-10-CM

## 2019-05-18 DIAGNOSIS — R29898 Other symptoms and signs involving the musculoskeletal system: Secondary | ICD-10-CM | POA: Diagnosis not present

## 2019-05-18 DIAGNOSIS — M6281 Muscle weakness (generalized): Secondary | ICD-10-CM

## 2019-05-18 NOTE — Therapy (Signed)
[place], [place], [address] Phone: [phone]   Fax:  [phone]  Physical Therapy Treatment  Patient Details  Name: Jordan Moody MRN: [id number] Date of Birth: 06/26/1966 Referring Provider (PT): Dr Lynne Leader    Encounter Date: 05/18/2019  PT End of Session - 05/18/19 0924    Visit Number  2    Number of Visits  12    Date for PT Re-Evaluation  06/24/19    PT Start Time  0844    PT Stop Time  0924    PT Time Calculation (min)  40 min    Activity Tolerance  Patient tolerated treatment well       Past Medical History:  Diagnosis Date  . Allergy   . Asthma   . Borderline high cholesterol 09/04/2017   10-yr ASCVD risk 4.9%  . Colon polyps   . Former smoker   . Hypertension   . Obesity   . OSA (obstructive sleep apnea)   . Venous insufficiency of both lower extremities 05/01/2018  . Venous ulcer, limited to breakdown of skin ([place]) 05/01/2018    Past Surgical History:  Procedure Laterality Date  . COLONOSCOPY W/ POLYPECTOMY    . KNEE ARTHROSCOPY Right     There were no vitals filed for this visit.  Subjective Assessment - 05/18/19 0844    Subjective  sleeping is better; then pain returns upon waking.  exercises are painful.    Pertinent History  HTN; Rt knee sugery at 53 yr old; arthritis; obesity; asthma; Rt shoulder pain for ~ 15 yrs    Diagnostic tests  xrays    Patient Stated Goals  get rid of the pain in the neck and shoulder; get active again    Currently in Pain?  Yes    Pain Score  4     Pain Location  Neck    Pain Orientation  Right    Pain Descriptors / Indicators  Aching;Dull    Pain Type  Acute pain;Chronic pain    Pain Radiating Towards  neck into shoulder    Pain Onset  More than a month ago    Pain Frequency  Intermittent    Aggravating Factors   lying down, sitting for prolonged periods; staying in one position for too long, driving    Pain Relieving Factors   injection, ice, TENS                       [facility] Adult PT Treatment/Exercise - 05/18/19 0847      Exercises   Exercises  Neck;Shoulder      Neck Exercises: Machines for Strengthening   UBE (Upper Arm Bike)  L4 x 4 min (2' each direction)      Neck Exercises: Standing   Neck Retraction  10 reps;10 secs    Neck Retraction Limitations  with noodle    Other Standing Exercises  scapular retraction 10x10 sec      Shoulder Exercises: Standing   Extension  Both;10 reps;Theraband    Theraband Level (Shoulder Extension)  Level 2 (Red)    Extension Limitations  5 sec hold    Row  Both;10 reps;Theraband    Theraband Level (Shoulder Row)  Level 2 (Red)    Row Limitations  5 sec hold      Shoulder Exercises: Stretch   Other Shoulder Stretches  doorway stretch lower two positions 20 sec x 2 reps  Manual Therapy   Manual Therapy  Soft tissue mobilization    Soft tissue mobilization  Rt upper traps, levator scapula and into teres minor and infraspinatus      Neck Exercises: Stretches   Upper Trapezius Stretch  Right;1 rep;30 seconds    Levator Stretch  Right;1 rep;60 seconds       Trigger Point Dry Needling - 05/18/19 0921    Consent Given?  Yes    Education Handout Provided  Previously provided    Muscles Treated Head and Neck  Upper trapezius    Muscles Treated Upper Quadrant  Infraspinatus;Teres minor    Upper Trapezius Response  Palpable increased muscle length;Twitch reponse elicited    Infraspinatus Response  Palpable increased muscle length    Teres minor Response  Twitch response elicited;Palpable increased muscle length                PT Long Term Goals - 05/13/19 1553      PT LONG TERM GOAL #1   Title  Improve postue and alignment with patient to demonstrate improve upright posture with posterior shoulder girdle engaged    Time  6    Period  Weeks    Status  New    Target Date  06/24/19      PT LONG TERM GOAL #2   Title  Increase  cervical ROM to WFL's and painfree in all planes of motion    Time  6    Period  Weeks    Status  New    Target Date  06/24/19      PT LONG TERM GOAL #3   Title  Patient reports pain free Rt shoulder/UE function for all ADL's    Time  6    Period  Weeks    Status  New    Target Date  06/24/19      PT LONG TERM GOAL #4   Title  Maintain to improve 24% limitation score for FOTO    Time  6    Period  Weeks    Status  New    Target Date  06/24/19      PT LONG TERM GOAL #5   Title  Independent in HEP    Time  6    Period  Weeks    Status  New    Target Date  06/24/19            Plan - 05/18/19 0924    Clinical Impression Statement  Pt tolerated session well today with decreased in pain following DN and manual therapy.  No goals met as only 2nd visit.    Personal Factors and Comorbidities  Comorbidity 1    Comorbidities  obesity; HTN    Stability/Clinical Decision Making  Stable/Uncomplicated    Rehab Potential  Good    PT Frequency  2x / week    PT Duration  6 weeks    PT Treatment/Interventions  Patient/family education;ADLs/Self Care Home Management;Cryotherapy;Electrical Stimulation;Iontophoresis 35m/ml Dexamethasone;Moist Heat;Ultrasound;Traction;Manual techniques;Dry needling;Neuromuscular re-education;Functional mobility training;Therapeutic activities;Therapeutic exercise    PT Next Visit Plan  review HEP; neuromuscular re-education working on cervical and shoulder girdle posture and alignment; stretch pecs; strengthening posterior shoulder girdle musculature; DN vs manual work areas of tightness Rt cervical and shoulder; modalities as indicated(has TESN unit for home)    PT Home Exercise Plan  [email or web address]      Patient will benefit from skilled therapeutic intervention in order to improve the following deficits and impairments:  Pain, Increased fascial restricitons, Increased muscle spasms, Decreased strength, Decreased mobility, Decreased range of motion, Decreased  activity tolerance  Visit Diagnosis: Cervicalgia  Acute pain of right shoulder  Other symptoms and signs involving the musculoskeletal system  Abnormal posture  Muscle weakness (generalized)     Problem List Patient Active Problem List   Diagnosis Date Noted  . Venous insufficiency of both lower extremities 05/01/2018  . Borderline high cholesterol 09/04/2017  . Former heavy tobacco smoker 09/03/2017  . Class 3 severe obesity due to excess calories with serious comorbidity and body mass index (BMI) of 50.0 to 59.9 in adult ([place]) 09/03/2017  . Mild persistent asthma without complication 10/09/2667  . Allergic state 08/04/2014  . Uncontrolled stage 2 hypertension 08/04/2014  . OSA on CPAP 08/04/2014     Laureen Abrahams, PT, DPT 05/18/19 9:25 AM     [facility] [place], [place], [address] Phone: [phone]   Fax:  [phone]  Name: Jordan Moody MRN: [id number] Date of Birth: [birth year]

## 2019-05-20 ENCOUNTER — Other Ambulatory Visit: Payer: Self-pay

## 2019-05-20 ENCOUNTER — Ambulatory Visit (INDEPENDENT_AMBULATORY_CARE_PROVIDER_SITE_OTHER): Payer: Managed Care, Other (non HMO) | Admitting: Physical Therapy

## 2019-05-20 ENCOUNTER — Encounter: Payer: Self-pay | Admitting: Physical Therapy

## 2019-05-20 DIAGNOSIS — R293 Abnormal posture: Secondary | ICD-10-CM | POA: Diagnosis not present

## 2019-05-20 DIAGNOSIS — R29898 Other symptoms and signs involving the musculoskeletal system: Secondary | ICD-10-CM

## 2019-05-20 DIAGNOSIS — M542 Cervicalgia: Secondary | ICD-10-CM

## 2019-05-20 DIAGNOSIS — M25511 Pain in right shoulder: Secondary | ICD-10-CM | POA: Diagnosis not present

## 2019-05-20 NOTE — Patient Instructions (Signed)

## 2019-05-20 NOTE — Therapy (Signed)
[facility] [address] [place], [place], [address] Phone: [phone]   Fax:  [phone]  Physical Therapy Treatment  Patient Details  Name: Jordan Moody MRN: [id number] Date of Birth: 1966/01/31 Referring Provider (PT): Dr Clementeen GrahamEvan Corey    Encounter Date: 05/20/2019  PT End of Session - 05/20/19 1611    Visit Number  3    Number of Visits  12    Date for PT Re-Evaluation  06/24/19    PT Start Time  1534    PT Stop Time  1610    PT Time Calculation (min)  36 min    Activity Tolerance  Patient tolerated treatment well       Past Medical History:  Diagnosis Date  . Allergy   . Asthma   . Borderline high cholesterol 09/04/2017   10-yr ASCVD risk 4.9%  . Colon polyps   . Former smoker   . Hypertension   . Obesity   . OSA (obstructive sleep apnea)   . Venous insufficiency of both lower extremities 05/01/2018  . Venous ulcer, limited to breakdown of skin (HCC) 05/01/2018    Past Surgical History:  Procedure Laterality Date  . COLONOSCOPY W/ POLYPECTOMY    . KNEE ARTHROSCOPY Right     There were no vitals filed for this visit.  Subjective Assessment - 05/20/19 1536    Subjective  Pt reports he has had a lot of improvement.  Pain was an 8, now it's a 4 at the most.    Patient Stated Goals  get rid of the pain in the neck and shoulder; get active again    Currently in Pain?  Yes    Pain Score  3     Pain Location  Neck    Pain Orientation  Right    Pain Descriptors / Indicators  Aching;Dull    Pain Radiating Towards  into shoulder    Aggravating Factors   driving, some arm movements, prolonged sitting    Pain Relieving Factors  ice, TENS         [facility] PT Assessment - 05/20/19 0001      Assessment   Medical Diagnosis  Rt cervical and shoulder pain     Referring Provider (PT)  Dr Clementeen GrahamEvan Corey     Onset Date/Surgical Date  02/23/19   pain in the shoulder for 15 yrs    Hand Dominance  Right    Next MD  Visit  PRN     Prior Therapy  for knee; maybe shoulder       AROM   Cervical - Left Side Bend  30 -no pain       [facility] Adult PT Treatment/Exercise - 05/20/19 0001      Neck Exercises: Standing   Neck Retraction  10 reps;10 secs    Neck Retraction Limitations  with noodle behind back; dialed back retraction to reduce pain.      Other Standing Exercises  L's x 5 reps with 5 sec hold.       Neck Exercises: Seated   Cervical Rotation  Left;5 reps      Shoulder Exercises: Standing   Extension  Both;10 reps;Theraband    Theraband Level (Shoulder Extension)  Level 2 (Red)    Extension Limitations  5 sec hold    Row  Both;10 reps;Theraband    Theraband Level (Shoulder Row)  Level 2 (Red)    Row Limitations  5 sec hold  Shoulder Exercises: Stretch   Other Shoulder Stretches  3 position doorway stretch x 2 reps of 20 sec (some tingling in hand with middle position.  bicep stretch bilat holding door frame x 15 sec x 2 reps       Manual Therapy   Manual therapy comments  I strip of sensitive skin Rock tape applied to prox tricep and infraspinatus with perpendicular strip at posterior shoulder. to decompress tissue and increase proprioception     Soft tissue mobilization  IASTM to Rt cervical paraspinals, upper trap, levator, posterior Rt shoulder and tricep (some pain down to forearm with IASTM to infraspinatus and prox tricep.)       Neck Exercises: Stretches   Upper Trapezius Stretch  Left;3 reps;10 seconds   sheet over shoulder to anchor   Levator Stretch  Left;2 reps;10 seconds                  PT Long Term Goals - 05/13/19 1553      PT LONG TERM GOAL #1   Title  Improve postue and alignment with patient to demonstrate improve upright posture with posterior shoulder girdle engaged    Time  6    Period  Weeks    Status  New    Target Date  06/24/19      PT LONG TERM GOAL #2   Title  Increase cervical ROM to WFL's and painfree in all planes of motion    Time  6     Period  Weeks    Status  New    Target Date  06/24/19      PT LONG TERM GOAL #3   Title  Patient reports pain free Rt shoulder/UE function for all ADL's    Time  6    Period  Weeks    Status  New    Target Date  06/24/19      PT LONG TERM GOAL #4   Title  Maintain to improve 24% limitation score for FOTO    Time  6    Period  Weeks    Status  New    Target Date  06/24/19      PT LONG TERM GOAL #5   Title  Independent in HEP    Time  6    Period  Weeks    Status  New    Target Date  06/24/19            Plan - 05/20/19 1612    Clinical Impression Statement  Overall improvement in functional mobility and ROM with less pain. Positive response to DN last session.  Pt reported increase in symptoms with use of UBE; reduced with rest. Pt tolerated all other exercises well.  Pt is progressing well towards therapy goals.    Personal Factors and Comorbidities  Comorbidity 1    Comorbidities  obesity; HTN    Stability/Clinical Decision Making  Stable/Uncomplicated    Rehab Potential  Good    PT Frequency  2x / week    PT Duration  6 weeks    PT Treatment/Interventions  Patient/family education;ADLs/Self Care Home Management;Cryotherapy;Electrical Stimulation;Iontophoresis 4mg /ml Dexamethasone;Moist Heat;Ultrasound;Traction;Manual techniques;Dry needling;Neuromuscular re-education;Functional mobility training;Therapeutic activities;Therapeutic exercise    PT Next Visit Plan  review HEP; neuromuscular re-education working on cervical and shoulder girdle posture and alignment; stretch pecs; strengthening posterior shoulder girdle musculature; DN vs manual work areas of tightness Rt cervical and shoulder; modalities as indicated(has TESN unit for home)    PT Home  Exercise Plan  [email or web address]       Patient will benefit from skilled therapeutic intervention in order to improve the following deficits and impairments:  Pain, Increased fascial restricitons, Increased muscle spasms, Decreased  strength, Decreased mobility, Decreased range of motion, Decreased activity tolerance  Visit Diagnosis: Cervicalgia  Acute pain of right shoulder  Other symptoms and signs involving the musculoskeletal system  Abnormal posture     Problem List Patient Active Problem List   Diagnosis Date Noted  . Venous insufficiency of both lower extremities 05/01/2018  . Borderline high cholesterol 09/04/2017  . Former heavy tobacco smoker 09/03/2017  . Class 3 severe obesity due to excess calories with serious comorbidity and body mass index (BMI) of 50.0 to 59.9 in adult ([place]) 09/03/2017  . Mild persistent asthma without complication 19/75/8832  . Allergic state 08/04/2014  . Uncontrolled stage 2 hypertension 08/04/2014  . OSA on CPAP 08/04/2014   Jordan Moody, Jordan Moody 05/20/19 4:22 PM  [place], [place], [address] Phone: [phone]   Fax:  [phone]  Name: Jordan Moody MRN: [id number] Date of Birth: 10/19/1965

## 2019-05-24 ENCOUNTER — Other Ambulatory Visit: Payer: Self-pay

## 2019-05-24 ENCOUNTER — Encounter: Payer: Self-pay | Admitting: Physical Therapy

## 2019-05-24 ENCOUNTER — Ambulatory Visit (INDEPENDENT_AMBULATORY_CARE_PROVIDER_SITE_OTHER): Payer: Managed Care, Other (non HMO) | Admitting: Physical Therapy

## 2019-05-24 DIAGNOSIS — M25511 Pain in right shoulder: Secondary | ICD-10-CM | POA: Diagnosis not present

## 2019-05-24 DIAGNOSIS — M542 Cervicalgia: Secondary | ICD-10-CM | POA: Diagnosis not present

## 2019-05-24 DIAGNOSIS — M6281 Muscle weakness (generalized): Secondary | ICD-10-CM

## 2019-05-24 DIAGNOSIS — R29898 Other symptoms and signs involving the musculoskeletal system: Secondary | ICD-10-CM

## 2019-05-24 DIAGNOSIS — R293 Abnormal posture: Secondary | ICD-10-CM

## 2019-05-24 NOTE — Therapy (Signed)
[place], [place], [address] Phone: [phone]   Fax:  [phone]  Physical Therapy Treatment  Patient Details  Name: Jordan Moody MRN: [id number] Date of Birth: 12-Feb-1966 Referring Provider (PT): Dr Lynne Leader    Encounter Date: 05/24/2019  PT End of Session - 05/24/19 0844    Visit Number  4    Number of Visits  12    Date for PT Re-Evaluation  06/24/19    PT Start Time  0804    PT Stop Time  0842    PT Time Calculation (min)  38 min    Activity Tolerance  Patient tolerated treatment well       Past Medical History:  Diagnosis Date  . Allergy   . Asthma   . Borderline high cholesterol 09/04/2017   10-yr ASCVD risk 4.9%  . Colon polyps   . Former smoker   . Hypertension   . Obesity   . OSA (obstructive sleep apnea)   . Venous insufficiency of both lower extremities 05/01/2018  . Venous ulcer, limited to breakdown of skin ([place]) 05/01/2018    Past Surgical History:  Procedure Laterality Date  . COLONOSCOPY W/ POLYPECTOMY    . KNEE ARTHROSCOPY Right     There were no vitals filed for this visit.  Subjective Assessment - 05/24/19 0806    Subjective  having some pain - did some garage work over the weekend and thinks that aggravated symptoms.    Pertinent History  HTN; Rt knee sugery at 53 yr old; arthritis; obesity; asthma; Rt shoulder pain for ~ 15 yrs    Patient Stated Goals  get rid of the pain in the neck and shoulder; get active again    Currently in Pain?  Yes    Pain Score  6     Pain Location  Neck    Pain Orientation  Right    Pain Descriptors / Indicators  Aching;Dull    Pain Type  Acute pain;Chronic pain    Pain Onset  More than a month ago    Pain Frequency  Intermittent    Aggravating Factors   driving, some arm movements, prolonged sitting    Pain Relieving Factors  ice, TENS                       [facility] Adult PT Treatment/Exercise - 05/24/19  0807      Neck Exercises: Machines for Strengthening   UBE (Upper Arm Bike)  L4 x 4 min (2' each direction)      Neck Exercises: Seated   Cervical Rotation  Both;10 reps    Cervical Rotation Limitations  5 sec hold    Shoulder Rolls  Backwards;10 reps      Neck Exercises: Prone   W Back  10 reps    W Back Limitations  with axial extension      Shoulder Exercises: Seated   Retraction  Both;10 reps    Retraction Limitations  5 sec hold    External Rotation  Both;10 reps    External Rotation Limitations  5 sec hold      Shoulder Exercises: Stretch   Other Shoulder Stretches  3 position doorway stretch x 2 reps of 20 sec (some tingling in hand with middle position.  bicep stretch bilat holding door frame x 15 sec x 2 reps       Manual Therapy   Soft tissue mobilization  IASTM to Rt cervical paraspinals, upper trap, levator      Neck Exercises: Stretches   Upper Trapezius Stretch  Left;3 reps;20 seconds    Levator Stretch  Left;3 reps;20 seconds       Trigger Point Dry Needling - 05/24/19 0827    Consent Given?  Yes    Education Handout Provided  Previously provided    Muscles Treated Head and Neck  Upper trapezius    Upper Trapezius Response  Palpable increased muscle length;Twitch reponse elicited                PT Long Term Goals - 05/13/19 1553      PT LONG TERM GOAL #1   Title  Improve postue and alignment with patient to demonstrate improve upright posture with posterior shoulder girdle engaged    Time  6    Period  Weeks    Status  New    Target Date  06/24/19      PT LONG TERM GOAL #2   Title  Increase cervical ROM to WFL's and painfree in all planes of motion    Time  6    Period  Weeks    Status  New    Target Date  06/24/19      PT LONG TERM GOAL #3   Title  Patient reports pain free Rt shoulder/UE function for all ADL's    Time  6    Period  Weeks    Status  New    Target Date  06/24/19      PT LONG TERM GOAL #4   Title  Maintain to  improve 24% limitation score for FOTO    Time  6    Period  Weeks    Status  New    Target Date  06/24/19      PT LONG TERM GOAL #5   Title  Independent in HEP    Time  6    Period  Weeks    Status  New    Target Date  06/24/19            Plan - 05/24/19 0845    Clinical Impression Statement  Pt tolerated session well today with reduction in pain after session.  Progressing well towards goals.    Personal Factors and Comorbidities  Comorbidity 1    Comorbidities  obesity; HTN    Stability/Clinical Decision Making  Stable/Uncomplicated    Rehab Potential  Good    PT Frequency  2x / week    PT Duration  6 weeks    PT Treatment/Interventions  Patient/family education;ADLs/Self Care Home Management;Cryotherapy;Electrical Stimulation;Iontophoresis 4mg /ml Dexamethasone;Moist Heat;Ultrasound;Traction;Manual techniques;Dry needling;Neuromuscular re-education;Functional mobility training;Therapeutic activities;Therapeutic exercise    PT Next Visit Plan  rstretch pecs; strengthening posterior shoulder girdle musculature; DN vs manual work areas of tightness Rt cervical and shoulder; modalities as indicated(has TENS unit for home)    PT Home Exercise Plan        Patient will benefit from skilled therapeutic intervention in order to improve the following deficits and impairments:  Pain, Increased fascial restricitons, Increased muscle spasms, Decreased strength, Decreased mobility, Decreased range of motion, Decreased activity tolerance  Visit Diagnosis: Cervicalgia  Acute pain of right shoulder  Other symptoms and signs involving the musculoskeletal system  Abnormal posture  Muscle weakness (generalized)     Problem List Patient Active Problem List   Diagnosis Date Noted  . Venous insufficiency of both lower extremities 05/01/2018  . Borderline high  cholesterol 09/04/2017  . Former heavy tobacco smoker 09/03/2017  . Class 3 severe obesity due to excess calories  with serious comorbidity and body mass index (BMI) of 50.0 to 59.9 in adult ([facility]) 09/03/2017  . Mild persistent asthma without complication 09/03/2017  . Allergic state 08/04/2014  . Uncontrolled stage 2 hypertension 08/04/2014  . OSA on CPAP 08/04/2014      Clarita CraneStephanie F Chaslyn Eisen, PT, DPT 05/24/19 8:46 AM     [facility] [address] [place] [place] [address] [place], [place], [address] Phone: [phone]   Fax:  [phone]  Name: Jordan Moody MRN: [id number] Date of Birth: [birth year]

## 2019-05-27 ENCOUNTER — Encounter: Payer: Managed Care, Other (non HMO) | Admitting: Physical Therapy

## 2019-06-01 ENCOUNTER — Other Ambulatory Visit: Payer: Self-pay

## 2019-06-01 ENCOUNTER — Encounter: Payer: Self-pay | Admitting: Physical Therapy

## 2019-06-01 ENCOUNTER — Ambulatory Visit (INDEPENDENT_AMBULATORY_CARE_PROVIDER_SITE_OTHER): Payer: Managed Care, Other (non HMO) | Admitting: Physical Therapy

## 2019-06-01 DIAGNOSIS — R293 Abnormal posture: Secondary | ICD-10-CM | POA: Diagnosis not present

## 2019-06-01 DIAGNOSIS — M542 Cervicalgia: Secondary | ICD-10-CM | POA: Diagnosis not present

## 2019-06-01 DIAGNOSIS — M6281 Muscle weakness (generalized): Secondary | ICD-10-CM

## 2019-06-01 DIAGNOSIS — R29898 Other symptoms and signs involving the musculoskeletal system: Secondary | ICD-10-CM

## 2019-06-01 DIAGNOSIS — M25511 Pain in right shoulder: Secondary | ICD-10-CM | POA: Diagnosis not present

## 2019-06-01 NOTE — Therapy (Signed)
[facility] [place] [address] [place] [address] [place], [place], [address] Phone: [phone]   Fax:  [phone]  Physical Therapy Treatment  Patient Details  Name: Jordan Moody MRN: [id number] Date of Birth: 04/14/1966 Referring Provider (PT): Dr Clementeen Graham    Encounter Date: 06/01/2019  PT End of Session - 06/01/19 0840    Visit Number  5    Number of Visits  12    Date for PT Re-Evaluation  06/24/19    PT Start Time  0804    PT Stop Time  0842    PT Time Calculation (min)  38 min    Activity Tolerance  Patient tolerated treatment well    Behavior During Therapy  [facility] for tasks assessed/performed       Past Medical History:  Diagnosis Date  . Allergy   . Asthma   . Borderline high cholesterol 09/04/2017   10-yr ASCVD risk 4.9%  . Colon polyps   . Former smoker   . Hypertension   . Obesity   . OSA (obstructive sleep apnea)   . Venous insufficiency of both lower extremities 05/01/2018  . Venous ulcer, limited to breakdown of skin ([facility]) 05/01/2018    Past Surgical History:  Procedure Laterality Date  . COLONOSCOPY W/ POLYPECTOMY    . KNEE ARTHROSCOPY Right     There were no vitals filed for this visit.  Subjective Assessment - 06/01/19 0805    Subjective  went to [facility] last week and pain was okay with driving and trip.  Today doing well still.    Pertinent History  HTN; Rt knee sugery at 53 yr old; arthritis; obesity; asthma; Rt shoulder pain for ~ 15 yrs    Patient Stated Goals  get rid of the pain in the neck and shoulder; get active again    Currently in Pain?  Yes    Pain Score  3     Pain Location  Neck    Pain Orientation  Right    Pain Descriptors / Indicators  Aching;Dull    Pain Type  Acute pain;Chronic pain    Pain Onset  More than a month ago    Pain Frequency  Intermittent    Aggravating Factors   prolonged sitting    Pain Relieving Factors  ice, TENS                       [facility] Adult PT  Treatment/Exercise - 06/01/19 0807      Neck Exercises: Machines for Strengthening   UBE (Upper Arm Bike)  L5 x 4 min (2' each direction)      Neck Exercises: Seated   Cervical Isometrics  Right lateral flexion;5 secs;10 reps    Cervical Rotation Limitations  rotation with side bending 10 x 5 sec      Neck Exercises: Supine   Neck Retraction  10 reps;5 secs      Shoulder Exercises: Seated   Retraction  Both;10 reps    Retraction Limitations  5 sec hold      Shoulder Exercises: Stretch   Other Shoulder Stretches  3 position doorway stretch x 2 reps of 20 sec (some tingling in hand with middle position.  bicep stretch bilat holding door frame x 15 sec x 2 reps       Manual Therapy   Manual Therapy  Soft tissue mobilization;Joint mobilization    Joint Mobilization  Rt to Lt cervical lateral glides grades 2-3  Soft tissue mobilization  Rt cervical paraspinals, upper trap and levator scapula      Neck Exercises: Stretches   Upper Trapezius Stretch  Left;3 reps;20 seconds    Levator Stretch  Left;3 reps;20 seconds       Trigger Point Dry Needling - 06/01/19 0001    Consent Given?  Yes    Education Handout Provided  Previously provided    Muscles Treated Head and Neck  Levator scapulae;Splenius capitus;Semispinalis capitus    Levator Scapulae Response  Twitch response elicited;Palpable increased muscle length    Splenius capitus Response  Twitch reponse elicited;Palpable increased muscle length    Semispinalis capitus Response  Twitch reponse elicited;Palpable increased muscle length                PT Long Term Goals - 05/13/19 1553      PT LONG TERM GOAL #1   Title  Improve postue and alignment with patient to demonstrate improve upright posture with posterior shoulder girdle engaged    Time  6    Period  Weeks    Status  New    Target Date  06/24/19      PT LONG TERM GOAL #2   Title  Increase cervical ROM to WFL's and painfree in all planes of motion    Time   6    Period  Weeks    Status  New    Target Date  06/24/19      PT LONG TERM GOAL #3   Title  Patient reports pain free Rt shoulder/UE function for all ADL's    Time  6    Period  Weeks    Status  New    Target Date  06/24/19      PT LONG TERM GOAL #4   Title  Maintain to improve 24% limitation score for FOTO    Time  6    Period  Weeks    Status  New    Target Date  06/24/19      PT LONG TERM GOAL #5   Title  Independent in HEP    Time  6    Period  Weeks    Status  New    Target Date  06/24/19            Plan - 06/01/19 0841    Clinical Impression Statement  Pt tolerated session well with reduction in pain and positive response to manual therapy an DN.  Progressing well with PT.    Personal Factors and Comorbidities  Comorbidity 1    Comorbidities  obesity; HTN    Stability/Clinical Decision Making  Stable/Uncomplicated    Rehab Potential  Good    PT Frequency  2x / week    PT Duration  6 weeks    PT Treatment/Interventions  Patient/family education;ADLs/Self Care Home Management;Cryotherapy;Electrical Stimulation;Iontophoresis 4mg /ml Dexamethasone;Moist Heat;Ultrasound;Traction;Manual techniques;Dry needling;Neuromuscular re-education;Functional mobility training;Therapeutic activities;Therapeutic exercise    PT Next Visit Plan  stretch pecs; strengthening posterior shoulder girdle musculature; DN vs manual work areas of tightness Rt cervical and shoulder; modalities as indicated(has TENS unit for home)    PT Home Exercise Plan  [id number]       Patient will benefit from skilled therapeutic intervention in order to improve the following deficits and impairments:  Pain, Increased fascial restricitons, Increased muscle spasms, Decreased strength, Decreased mobility, Decreased range of motion, Decreased activity tolerance  Visit Diagnosis: Cervicalgia  Acute pain of right shoulder  Other symptoms and signs involving the  musculoskeletal system  Abnormal  posture  Muscle weakness (generalized)     Problem List Patient Active Problem List   Diagnosis Date Noted  . Venous insufficiency of both lower extremities 05/01/2018  . Borderline high cholesterol 09/04/2017  . Former heavy tobacco smoker 09/03/2017  . Class 3 severe obesity due to excess calories with serious comorbidity and body mass index (BMI) of 50.0 to 59.9 in adult ([place]) 09/03/2017  . Mild persistent asthma without complication 95/32/0233  . Allergic state 08/04/2014  . Uncontrolled stage 2 hypertension 08/04/2014  . OSA on CPAP 08/04/2014      Laureen Abrahams, PT, DPT 06/01/19 9:05 AM     [facility] [place], [place], [address] Phone: [phone]   Fax:  [phone]  Name: MORRILL BOMKAMP MRN: [id number] Date of Birth: August 05, 1966

## 2019-06-03 ENCOUNTER — Other Ambulatory Visit: Payer: Self-pay

## 2019-06-03 ENCOUNTER — Encounter: Payer: Self-pay | Admitting: Physical Therapy

## 2019-06-03 ENCOUNTER — Ambulatory Visit (INDEPENDENT_AMBULATORY_CARE_PROVIDER_SITE_OTHER): Payer: Managed Care, Other (non HMO) | Admitting: Physical Therapy

## 2019-06-03 DIAGNOSIS — M542 Cervicalgia: Secondary | ICD-10-CM | POA: Diagnosis not present

## 2019-06-03 DIAGNOSIS — R29898 Other symptoms and signs involving the musculoskeletal system: Secondary | ICD-10-CM | POA: Diagnosis not present

## 2019-06-03 DIAGNOSIS — R293 Abnormal posture: Secondary | ICD-10-CM

## 2019-06-03 DIAGNOSIS — M25511 Pain in right shoulder: Secondary | ICD-10-CM

## 2019-06-03 DIAGNOSIS — M6281 Muscle weakness (generalized): Secondary | ICD-10-CM

## 2019-06-03 NOTE — Therapy (Addendum)
[facility] [place] [address] [place] [address] [place], [place], [address] Phone: [phone]   Fax:  [phone]  Physical Therapy Treatment  Patient Details  Name: Jordan Moody MRN: [id number] Date of Birth: [birth year] Referring Provider (PT): Dr Clementeen Graham    Encounter Date: 06/03/2019  PT End of Session - 06/03/19 1602    Visit Number  6    Number of Visits  12    Date for PT Re-Evaluation  06/24/19    PT Start Time  1530    PT Stop Time  1610    PT Time Calculation (min)  40 min    Activity Tolerance  Patient tolerated treatment well    Behavior During Therapy  [facility] for tasks assessed/performed       Past Medical History:  Diagnosis Date  . Allergy   . Asthma   . Borderline high cholesterol 09/04/2017   10-yr ASCVD risk 4.9%  . Colon polyps   . Former smoker   . Hypertension   . Obesity   . OSA (obstructive sleep apnea)   . Venous insufficiency of both lower extremities 05/01/2018  . Venous ulcer, limited to breakdown of skin ([facility]) 05/01/2018    Past Surgical History:  Procedure Laterality Date  . COLONOSCOPY W/ POLYPECTOMY    . KNEE ARTHROSCOPY Right     There were no vitals filed for this visit.  Subjective Assessment - 06/03/19 1531    Subjective  neck is sore, "but it doesn't really hurt." having a little more numbness into hands.    Pertinent History  HTN; Rt knee sugery at 53 yr old; arthritis; obesity; asthma; Rt shoulder pain for ~ 15 yrs    Patient Stated Goals  get rid of the pain in the neck and shoulder; get active again    Currently in Pain?  No/denies    Pain Onset  More than a month ago                       [facility] Adult PT Treatment/Exercise - 06/03/19 1532      Neck Exercises: Machines for Strengthening   UBE (Upper Arm Bike)  L5 x 4 min (2' each direction)      Neck Exercises: Standing   Other Standing Exercises  median nerve flossing 10x10 sec; ulnar nerve flossing 10x10 sec       Shoulder Exercises: Standing   Other Standing Exercises  axial extension 10 sec x 5; scap squeeze 10 sec x 5; L's x 10       Shoulder Exercises: Stretch   Other Shoulder Stretches  3 position doorway stretch x 2 reps of 20 sec (some tingling in hand with middle position.  bicep stretch bilat holding door frame x 15 sec x 2 reps       Modalities   Modalities  Traction      Traction   Type of Traction  Cervical    Min (lbs)  18    Max (lbs)  23    Hold Time  60    Rest Time  20    Time  15             PT Education - 06/03/19 1555    Education Details  ulnar, median nerve glides    Person(s) Educated  Patient    Methods  Explanation;Demonstration;Handout    Comprehension  Verbalized understanding;Returned demonstration          PT Long  Term Goals - 05/13/19 1553      PT LONG TERM GOAL #1   Title  Improve postue and alignment with patient to demonstrate improve upright posture with posterior shoulder girdle engaged    Time  6    Period  Weeks    Status  New    Target Date  06/24/19      PT LONG TERM GOAL #2   Title  Increase cervical ROM to WFL's and painfree in all planes of motion    Time  6    Period  Weeks    Status  New    Target Date  06/24/19      PT LONG TERM GOAL #3   Title  Patient reports pain free Rt shoulder/UE function for all ADL's    Time  6    Period  Weeks    Status  New    Target Date  06/24/19      PT LONG TERM GOAL #4   Title  Maintain to improve 24% limitation score for FOTO    Time  6    Period  Weeks    Status  New    Target Date  06/24/19      PT LONG TERM GOAL #5   Title  Independent in HEP    Time  6    Period  Weeks    Status  New    Target Date  06/24/19            Plan - 06/03/19 1603    Clinical Impression Statement  Pt with mild numbness into RUE, so traction performed today with neural flossing to UE nerves.  Will continue to benefit from PT to maximize function and decrease pain.    Personal Factors and  Comorbidities  Comorbidity 1    Comorbidities  obesity; HTN    Stability/Clinical Decision Making  Stable/Uncomplicated    Rehab Potential  Good    PT Frequency  2x / week    PT Duration  6 weeks    PT Treatment/Interventions  Patient/family education;ADLs/Self Care Home Management;Cryotherapy;Electrical Stimulation;Iontophoresis 4mg /ml Dexamethasone;Moist Heat;Ultrasound;Traction;Manual techniques;Dry needling;Neuromuscular re-education;Functional mobility training;Therapeutic activities;Therapeutic exercise    PT Next Visit Plan  stretch pecs; strengthening posterior shoulder girdle musculature; DN vs manual work areas of tightness Rt cervical and shoulder; modalities as indicated(has TENS unit for home); assess response to traction    PT Home Exercise Plan  [id number]       Patient will benefit from skilled therapeutic intervention in order to improve the following deficits and impairments:  Pain, Increased fascial restricitons, Increased muscle spasms, Decreased strength, Decreased mobility, Decreased range of motion, Decreased activity tolerance  Visit Diagnosis: Cervicalgia  Acute pain of right shoulder  Other symptoms and signs involving the musculoskeletal system  Abnormal posture  Muscle weakness (generalized)     Problem List Patient Active Problem List   Diagnosis Date Noted  . Venous insufficiency of both lower extremities 05/01/2018  . Borderline high cholesterol 09/04/2017  . Former heavy tobacco smoker 09/03/2017  . Class 3 severe obesity due to excess calories with serious comorbidity and body mass index (BMI) of 50.0 to 59.9 in adult ([place]) 09/03/2017  . Mild persistent asthma without complication 32/44/0102  . Allergic state 08/04/2014  . Uncontrolled stage 2 hypertension 08/04/2014  . OSA on CPAP 08/04/2014      Laureen Abrahams, PT, DPT 06/03/19 4:19 PM     [place] [address] [place] [address] [place], [place], [address] Phone: [phone]   Fax:  [phone]  Name: Lucina MellowBrian L Gouge MRN: [id number] Date of Birth: [birth year]

## 2019-06-03 NOTE — Patient Instructions (Signed)
Access Code: [email or web address]  URL: [email or web address]  Date: 06/03/2019  Prepared by: Faustino Congress   Exercises  Seated Cervical Retraction - 10 reps - 1 sets - 3x daily - 7x weekly  Standing Scapular Retraction - 10 reps - 1 sets - 10 hold - 3x daily - 7x weekly  Shoulder External Rotation and Scapular Retraction - 10 reps - 1 sets - hold - 3x daily - 7x weekly  Doorway Pec Stretch at 60 Degrees Abduction - 3 reps - 1 sets - 3x daily - 7x weekly  Doorway Pec Stretch at 90 Degrees Abduction - 3 reps - 1 sets - 30 seconds hold - 3x daily - 7x weekly  Doorway Pec Stretch at 120 Degrees Abduction - 3 reps - 1 sets - 30 second hold hold - 3x daily - 7x weekly  Ulnar Nerve Tensioner - 10 reps - 1 sets - 5-10 sec hold - 1x daily - 7x weekly  Standing Median Nerve Glide - 10 reps - 1 sets - 5-10 sec hold - 1x daily - 7x weekly  Patient Education  Trigger Point Dry Needling

## 2019-06-05 ENCOUNTER — Other Ambulatory Visit: Payer: Self-pay | Admitting: Physician Assistant

## 2019-06-05 DIAGNOSIS — I10 Essential (primary) hypertension: Secondary | ICD-10-CM

## 2019-06-08 ENCOUNTER — Other Ambulatory Visit: Payer: Self-pay

## 2019-06-08 ENCOUNTER — Ambulatory Visit (INDEPENDENT_AMBULATORY_CARE_PROVIDER_SITE_OTHER): Payer: Managed Care, Other (non HMO) | Admitting: Physical Therapy

## 2019-06-08 ENCOUNTER — Encounter: Payer: Self-pay | Admitting: Physical Therapy

## 2019-06-08 DIAGNOSIS — R293 Abnormal posture: Secondary | ICD-10-CM | POA: Diagnosis not present

## 2019-06-08 DIAGNOSIS — M25511 Pain in right shoulder: Secondary | ICD-10-CM | POA: Diagnosis not present

## 2019-06-08 DIAGNOSIS — M542 Cervicalgia: Secondary | ICD-10-CM

## 2019-06-08 DIAGNOSIS — R29898 Other symptoms and signs involving the musculoskeletal system: Secondary | ICD-10-CM

## 2019-06-08 DIAGNOSIS — M6281 Muscle weakness (generalized): Secondary | ICD-10-CM

## 2019-06-08 NOTE — Therapy (Signed)
[facility] [address] [place], [place], [address] Phone: [phone]   Fax:  [phone]  Physical Therapy Treatment  Patient Details  Name: Jordan Moody MRN: [id number] Date of Birth: [birth year] Referring Provider (PT): Dr Clementeen GrahamEvan Corey    Encounter Date: 06/08/2019  PT End of Session - 06/08/19 1259    Visit Number  7    Number of Visits  12    Date for PT Re-Evaluation  06/24/19    PT Start Time  1146    PT Stop Time  1232    PT Time Calculation (min)  46 min    Activity Tolerance  Patient tolerated treatment well    Behavior During Therapy  [facility] for tasks assessed/performed       Past Medical History:  Diagnosis Date  . Allergy   . Asthma   . Borderline high cholesterol 09/04/2017   10-yr ASCVD risk 4.9%  . Colon polyps   . Former smoker   . Hypertension   . Obesity   . OSA (obstructive sleep apnea)   . Venous insufficiency of both lower extremities 05/01/2018  . Venous ulcer, limited to breakdown of skin (HCC) 05/01/2018    Past Surgical History:  Procedure Laterality Date  . COLONOSCOPY W/ POLYPECTOMY    . KNEE ARTHROSCOPY Right     There were no vitals filed for this visit.  Subjective Assessment - 06/08/19 1146    Subjective  overall better; still having numbness.  turning to Rt still aggravating    Pertinent History  HTN; Rt knee sugery at 53 yr old; arthritis; obesity; asthma; Rt shoulder pain for ~ 15 yrs    Patient Stated Goals  get rid of the pain in the neck and shoulder; get active again    Currently in Pain?  Yes    Pain Score  3     Pain Location  Neck    Pain Orientation  Right    Pain Descriptors / Indicators  Aching;Dull    Pain Type  Acute pain;Chronic pain    Pain Onset  More than a month ago    Pain Frequency  Intermittent    Aggravating Factors   prolonged sitting    Pain Relieving Factors  ice, TENS                       [facility] Adult PT Treatment/Exercise -  06/08/19 1148      Neck Exercises: Machines for Strengthening   UBE (Upper Arm Bike)  L5 x 4 min (2' each direction)      Neck Exercises: Standing   Other Standing Exercises  median nerve flossing 10x10 sec; ulnar nerve flossing 10x10 sec      Shoulder Exercises: Stretch   Other Shoulder Stretches  3 position doorway stretch x 2 reps of 20 sec (some tingling in hand with middle position.  bicep stretch bilat holding door frame x 15 sec x 2 reps       Traction   Type of Traction  Cervical    Min (lbs)  20    Max (lbs)  25    Hold Time  60    Rest Time  20    Time  15      Manual Therapy   Manual Therapy  Soft tissue mobilization    Soft tissue mobilization  Rt cervical paraspinals, upper trap and levator scapula       Trigger Point Dry  Needling - 06/08/19 1258    Consent Given?  Yes    Education Handout Provided  Previously provided    Muscles Treated Head and Neck  Upper trapezius;Levator scapulae    Upper Trapezius Response  Palpable increased muscle length;Twitch reponse elicited    Levator Scapulae Response  Twitch response elicited;Palpable increased muscle length                PT Long Term Goals - 05/13/19 1553      PT LONG TERM GOAL #1   Title  Improve postue and alignment with patient to demonstrate improve upright posture with posterior shoulder girdle engaged    Time  6    Period  Weeks    Status  New    Target Date  06/24/19      PT LONG TERM GOAL #2   Title  Increase cervical ROM to WFL's and painfree in all planes of motion    Time  6    Period  Weeks    Status  New    Target Date  06/24/19      PT LONG TERM GOAL #3   Title  Patient reports pain free Rt shoulder/UE function for all ADL's    Time  6    Period  Weeks    Status  New    Target Date  06/24/19      PT LONG TERM GOAL #4   Title  Maintain to improve 24% limitation score for FOTO    Time  6    Period  Weeks    Status  New    Target Date  06/24/19      PT LONG TERM GOAL #5    Title  Independent in HEP    Time  6    Period  Weeks    Status  New    Target Date  06/24/19            Plan - 06/08/19 1259    Clinical Impression Statement  Pt reported reduction in symptoms following DN and manual therapy today, and symptoms that he is having are less intense overall.  Slowly progressing with PT.    Personal Factors and Comorbidities  Comorbidity 1    Comorbidities  obesity; HTN    Stability/Clinical Decision Making  Stable/Uncomplicated    Rehab Potential  Good    PT Frequency  2x / week    PT Duration  6 weeks    PT Treatment/Interventions  Patient/family education;ADLs/Self Care Home Management;Cryotherapy;Electrical Stimulation;Iontophoresis 4mg /ml Dexamethasone;Moist Heat;Ultrasound;Traction;Manual techniques;Dry needling;Neuromuscular re-education;Functional mobility training;Therapeutic activities;Therapeutic exercise    PT Next Visit Plan  stretch pecs; strengthening posterior shoulder girdle musculature; DN vs manual work areas of tightness Rt cervical and shoulder; modalities as indicated(has TENS unit for home); assess response to traction    PT Home Exercise Plan  [email or web address]       Patient will benefit from skilled therapeutic intervention in order to improve the following deficits and impairments:  Pain, Increased fascial restricitons, Increased muscle spasms, Decreased strength, Decreased mobility, Decreased range of motion, Decreased activity tolerance  Visit Diagnosis: Cervicalgia  Acute pain of right shoulder  Other symptoms and signs involving the musculoskeletal system  Abnormal posture  Muscle weakness (generalized)     Problem List Patient Active Problem List   Diagnosis Date Noted  . Venous insufficiency of both lower extremities 05/01/2018  . Borderline high cholesterol 09/04/2017  . Former heavy tobacco smoker 09/03/2017  . Class 3 severe obesity  due to excess calories with serious comorbidity and body mass index (BMI) of  50.0 to 59.9 in adult (HCC) 09/03/2017  . Mild persistent asthma without complication 09/03/2017  . Allergic state 08/04/2014  . Uncontrolled stage 2 hypertension 08/04/2014  . OSA on CPAP 08/04/2014      Jordan Moody, PT, DPT 06/08/19 1:03 PM     [facility] [address] [place], [place], [address] Phone: [phone]   Fax:  [phone]  Name: EMMETTE KATT MRN: [id number] Date of Birth: 1965-09-27

## 2019-06-09 ENCOUNTER — Other Ambulatory Visit: Payer: Self-pay | Admitting: Physician Assistant

## 2019-06-09 DIAGNOSIS — I10 Essential (primary) hypertension: Secondary | ICD-10-CM

## 2019-06-09 MED ORDER — OLMESARTAN MEDOXOMIL-HCTZ 40-25 MG PO TABS: 1.0000 | ORAL_TABLET | Freq: Every day | ORAL | 0 refills | Status: DC

## 2019-06-11 ENCOUNTER — Ambulatory Visit (INDEPENDENT_AMBULATORY_CARE_PROVIDER_SITE_OTHER): Payer: Managed Care, Other (non HMO) | Admitting: Physical Therapy

## 2019-06-11 ENCOUNTER — Other Ambulatory Visit: Payer: Self-pay

## 2019-06-11 ENCOUNTER — Encounter: Payer: Self-pay | Admitting: Physical Therapy

## 2019-06-11 DIAGNOSIS — M542 Cervicalgia: Secondary | ICD-10-CM | POA: Diagnosis not present

## 2019-06-11 DIAGNOSIS — M6281 Muscle weakness (generalized): Secondary | ICD-10-CM

## 2019-06-11 DIAGNOSIS — M25511 Pain in right shoulder: Secondary | ICD-10-CM | POA: Diagnosis not present

## 2019-06-11 DIAGNOSIS — R29898 Other symptoms and signs involving the musculoskeletal system: Secondary | ICD-10-CM | POA: Diagnosis not present

## 2019-06-11 DIAGNOSIS — R293 Abnormal posture: Secondary | ICD-10-CM | POA: Diagnosis not present

## 2019-06-11 NOTE — Therapy (Signed)
[facility] [place] [address] [place], [place], [address] Phone: [phone]   Fax:  [phone]  Physical Therapy Treatment  Patient Details  Name: Jordan Moody MRN: [id number] Date of Birth: [birth year] Referring Provider (PT): Dr Clementeen Graham    Encounter Date: 06/11/2019  PT End of Session - 06/11/19 1136    Visit Number  8    Number of Visits  12    Date for PT Re-Evaluation  06/24/19    PT Start Time  1100    PT Stop Time  1142    PT Time Calculation (min)  42 min    Activity Tolerance  Patient tolerated treatment well    Behavior During Therapy  [facility] for tasks assessed/performed       Past Medical History:  Diagnosis Date  . Allergy   . Asthma   . Borderline high cholesterol 09/04/2017   10-yr ASCVD risk 4.9%  . Colon polyps   . Former smoker   . Hypertension   . Obesity   . OSA (obstructive sleep apnea)   . Venous insufficiency of both lower extremities 05/01/2018  . Venous ulcer, limited to breakdown of skin (HCC) 05/01/2018    Past Surgical History:  Procedure Laterality Date  . COLONOSCOPY W/ POLYPECTOMY    . KNEE ARTHROSCOPY Right     There were no vitals filed for this visit.  Subjective Assessment - 06/11/19 1100    Subjective  "it's fine, still having some issues but not keeping him up at night."    Pertinent History  HTN; Rt knee sugery at 53 yr old; arthritis; obesity; asthma; Rt shoulder pain for ~ 15 yrs    Patient Stated Goals  get rid of the pain in the neck and shoulder; get active again    Currently in Pain?  Yes    Pain Score  3     Pain Location  Shoulder    Pain Orientation  Right    Pain Descriptors / Indicators  Aching;Numbness    Pain Type  Acute pain;Chronic pain    Pain Onset  More than a month ago    Pain Frequency  Intermittent    Aggravating Factors   prolonges sitting    Pain Relieving Factors  ice, TENS                       [facility] Adult PT  Treatment/Exercise - 06/11/19 1101      Neck Exercises: Machines for Strengthening   UBE (Upper Arm Bike)  L5 x 4 min (2' each direction)      Shoulder Exercises: Stretch   Other Shoulder Stretches  3 position doorway stretch x 2 reps of 20 sec (some tingling in hand with middle position.  bicep stretch bilat holding door frame x 15 sec x 2 reps       Traction   Type of Traction  Cervical    Min (lbs)  20    Max (lbs)  25    Hold Time  60    Rest Time  20    Time  10      Manual Therapy   Manual Therapy  Soft tissue mobilization    Soft tissue mobilization  IASTM along median nerve       Trigger Point Dry Needling - 06/11/19 1136    Consent Given?  Yes    Education Handout Provided  Previously provided    Muscles Treated Upper  Quadrant  Deltoid    Deltoid Response  Twitch response elicited;Palpable increased muscle length                PT Long Term Goals - 05/13/19 1553      PT LONG TERM GOAL #1   Title  Improve postue and alignment with patient to demonstrate improve upright posture with posterior shoulder girdle engaged    Time  6    Period  Weeks    Status  New    Target Date  06/24/19      PT LONG TERM GOAL #2   Title  Increase cervical ROM to WFL's and painfree in all planes of motion    Time  6    Period  Weeks    Status  New    Target Date  06/24/19      PT LONG TERM GOAL #3   Title  Patient reports pain free Rt shoulder/UE function for all ADL's    Time  6    Period  Weeks    Status  New    Target Date  06/24/19      PT LONG TERM GOAL #4   Title  Maintain to improve 24% limitation score for FOTO    Time  6    Period  Weeks    Status  New    Target Date  06/24/19      PT LONG TERM GOAL #5   Title  Independent in HEP    Time  6    Period  Weeks    Status  New    Target Date  06/24/19            Plan - 06/11/19 1137    Clinical Impression Statement  Pt overall reporing improvement in sleep and pain.  Has increased neural  tension throughout RUE and trigger points in Rt wrist flexors with reproduces numbness into Rt hand.  May benefit from DN to this area.    Personal Factors and Comorbidities  Comorbidity 1    Comorbidities  obesity; HTN    Stability/Clinical Decision Making  Stable/Uncomplicated    Rehab Potential  Good    PT Frequency  2x / week    PT Duration  6 weeks    PT Treatment/Interventions  Patient/family education;ADLs/Self Care Home Management;Cryotherapy;Electrical Stimulation;Iontophoresis 4mg /ml Dexamethasone;Moist Heat;Ultrasound;Traction;Manual techniques;Dry needling;Neuromuscular re-education;Functional mobility training;Therapeutic activities;Therapeutic exercise    PT Next Visit Plan  continue post shoulder strengthening, consider DN to wrist flexors, cont traction and manual/modalities/DN PRN    PT Home Exercise Plan  [id number]    Consulted and Agree with Plan of Care  Patient       Patient will benefit from skilled therapeutic intervention in order to improve the following deficits and impairments:  Pain, Increased fascial restricitons, Increased muscle spasms, Decreased strength, Decreased mobility, Decreased range of motion, Decreased activity tolerance  Visit Diagnosis: Cervicalgia  Acute pain of right shoulder  Other symptoms and signs involving the musculoskeletal system  Abnormal posture  Muscle weakness (generalized)     Problem List Patient Active Problem List   Diagnosis Date Noted  . Venous insufficiency of both lower extremities 05/01/2018  . Borderline high cholesterol 09/04/2017  . Former heavy tobacco smoker 09/03/2017  . Class 3 severe obesity due to excess calories with serious comorbidity and body mass index (BMI) of 50.0 to 59.9 in adult (HCC) 09/03/2017  . Mild persistent asthma without complication 09/03/2017  . Allergic state 08/04/2014  . Uncontrolled  stage 2 hypertension 08/04/2014  . OSA on CPAP 08/04/2014       Laureen Abrahams, PT,  DPT 06/11/19 11:42 AM     [facility] [place], [place], [address] Phone: [phone]   Fax:  [phone]  Name: Jordan Moody MRN: [id number] Date of Birth: Mar 21, 1966

## 2019-06-15 ENCOUNTER — Ambulatory Visit (INDEPENDENT_AMBULATORY_CARE_PROVIDER_SITE_OTHER): Payer: Managed Care, Other (non HMO) | Admitting: Physical Therapy

## 2019-06-15 ENCOUNTER — Encounter: Payer: Self-pay | Admitting: Physical Therapy

## 2019-06-15 ENCOUNTER — Other Ambulatory Visit: Payer: Self-pay

## 2019-06-15 DIAGNOSIS — R29898 Other symptoms and signs involving the musculoskeletal system: Secondary | ICD-10-CM

## 2019-06-15 DIAGNOSIS — M25511 Pain in right shoulder: Secondary | ICD-10-CM | POA: Diagnosis not present

## 2019-06-15 DIAGNOSIS — M542 Cervicalgia: Secondary | ICD-10-CM | POA: Diagnosis not present

## 2019-06-15 NOTE — Therapy (Addendum)
[place], [place], [address] Phone: [phone]   Fax:  [phone]  Physical Therapy Treatment  Patient Details  Name: Jordan Moody MRN: [id number] Date of Birth: Jul 25, 1966 Referring Provider (PT): Dr Lynne Leader    Encounter Date: 06/15/2019  PT End of Session - 06/15/19 0850    Visit Number  9    Number of Visits  12    Date for PT Re-Evaluation  06/24/19    PT Start Time  0851    PT Stop Time  0931    PT Time Calculation (min)  40 min    Activity Tolerance  Patient tolerated treatment well    Behavior During Therapy  [facility] for tasks assessed/performed       Past Medical History:  Diagnosis Date  . Allergy   . Asthma   . Borderline high cholesterol 09/04/2017   10-yr ASCVD risk 4.9%  . Colon polyps   . Former smoker   . Hypertension   . Obesity   . OSA (obstructive sleep apnea)   . Venous insufficiency of both lower extremities 05/01/2018  . Venous ulcer, limited to breakdown of skin ([place]) 05/01/2018    Past Surgical History:  Procedure Laterality Date  . COLONOSCOPY W/ POLYPECTOMY    . KNEE ARTHROSCOPY Right     There were no vitals filed for this visit.  Subjective Assessment - 06/15/19 0854    Subjective  Patient reporting no pain or numbness today, but had increased sx into RUE on Saturday and Sunday.    Pertinent History  HTN; Rt knee sugery at 53 yr old; arthritis; obesity; asthma; Rt shoulder pain for ~ 15 yrs    Patient Stated Goals  get rid of the pain in the neck and shoulder; get active again    Currently in Pain?  No/denies                       [facility] Adult PT Treatment/Exercise - 06/15/19 0001      Neck Exercises: Machines for Strengthening   UBE (Upper Arm Bike)  L5 x 4 min (2' each direction)      Neck Exercises: Standing   Neck Retraction  5 reps    Neck Retraction Limitations  pt reports pain with these so puts head down; moved to supine       Neck Exercises: Supine   Neck Retraction  10 reps;5 secs      Shoulder Exercises: Stretch   Other Shoulder Stretches  3 position doorway stretch x 2 reps of 20 sec (some tingling in hand with middle position.  bicep stretch bilat holding door frame x 15 sec x 2 reps       Hand Exercises for Cervical Radiculopathy   Other Hand Exercise for Cervical Radiculopathy  wrist flex/ext stretches 2x 30 sec      Manual Therapy   Manual Therapy  Soft tissue mobilization    Soft tissue mobilization  to right UT, wrist flexors and extensors       Trigger Point Dry Needling - 06/15/19 0001    Consent Given?  Yes    Education Handout Provided  Previously provided    Muscles Treated Head and Neck  Upper trapezius    Muscles Treated Wrist/Hand  Extensor carpi radialis longus/brevis    Upper Trapezius Response  Palpable increased muscle length    Extensor carpi radialis longus/brevis Response  Twitch response elicited;Palpable increased muscle  length                PT Long Term Goals - 06/15/19 0856      PT LONG TERM GOAL #1   Title  Improve postue and alignment with patient to demonstrate improve upright posture with posterior shoulder girdle engaged    Status  On-going      PT LONG TERM GOAL #2   Title  Increase cervical ROM to WFL's and painfree in all planes of motion    Baseline  mild decrease with right rotation    Status  Achieved      PT LONG TERM GOAL #3   Title  Patient reports pain free Rt shoulder/UE function for all ADL's    Time  6    Period  Weeks    Status  Partially Met      PT LONG TERM GOAL #5   Title  Independent in HEP    Status  Partially Met            Plan - 06/15/19 1217    Clinical Impression Statement  Patient improving overall with sx. He had Sx into RUE on Saturday and Sunday but resolved. PT advised sleeping with towel roll. He responded well to DN in wrist extensors and to wrist stretches. Neck retraction advised every 2 hours during the  day with more frequent scapular retraction.    PT Treatment/Interventions  Patient/family education;ADLs/Self Care Home Management;Cryotherapy;Electrical Stimulation;Iontophoresis 4mg/ml Dexamethasone;Moist Heat;Ultrasound;Traction;Manual techniques;Dry needling;Neuromuscular re-education;Functional mobility training;Therapeutic activities;Therapeutic exercise    PT Next Visit Plan  10th visit progress note/foto; Assess towel roll/wrist stretches; review cerv retraction supine.continue post shoulder strengthening,  cont traction and manual/modalities/DN PRN    PT Home Exercise Plan  [email or web address]       Patient will benefit from skilled therapeutic intervention in order to improve the following deficits and impairments:  Pain, Increased fascial restricitons, Increased muscle spasms, Decreased strength, Decreased mobility, Decreased range of motion, Decreased activity tolerance  Visit Diagnosis: Cervicalgia  Acute pain of right shoulder  Other symptoms and signs involving the musculoskeletal system     Problem List Patient Active Problem List   Diagnosis Date Noted  . Venous insufficiency of both lower extremities 05/01/2018  . Borderline high cholesterol 09/04/2017  . Former heavy tobacco smoker 09/03/2017  . Class 3 severe obesity due to excess calories with serious comorbidity and body mass index (BMI) of 50.0 to 59.9 in adult (HCC) 09/03/2017  . Mild persistent asthma without complication 09/03/2017  . Allergic state 08/04/2014  . Uncontrolled stage 2 hypertension 08/04/2014  . OSA on CPAP 08/04/2014    Julie Riddles PT 06/15/2019, 12:27 PM  [facility] [address] [place], [place], [address] Phone: [phone]   Fax:  [phone]  Name: Jordan Moody MRN: [id number] Date of Birth: 05/12/1966   

## 2019-06-18 ENCOUNTER — Other Ambulatory Visit: Payer: Self-pay

## 2019-06-18 ENCOUNTER — Ambulatory Visit (INDEPENDENT_AMBULATORY_CARE_PROVIDER_SITE_OTHER): Payer: Managed Care, Other (non HMO) | Admitting: Physical Therapy

## 2019-06-18 DIAGNOSIS — R29898 Other symptoms and signs involving the musculoskeletal system: Secondary | ICD-10-CM

## 2019-06-18 DIAGNOSIS — M542 Cervicalgia: Secondary | ICD-10-CM

## 2019-06-18 DIAGNOSIS — R293 Abnormal posture: Secondary | ICD-10-CM | POA: Diagnosis not present

## 2019-06-18 DIAGNOSIS — M25511 Pain in right shoulder: Secondary | ICD-10-CM | POA: Diagnosis not present

## 2019-06-18 NOTE — Therapy (Signed)
[place], [place], [address] Phone: [phone]   Fax:  [phone]  Physical Therapy Treatment  Patient Details  Name: Jordan Moody MRN: [id number] Date of Birth: Aug 06, 1966 Referring Provider (PT): Dr Lynne Leader    Encounter Date: 06/18/2019  PT End of Session - 06/18/19 1321    Visit Number  10    Number of Visits  12    Date for PT Re-Evaluation  06/24/19    PT Start Time  9201    PT Stop Time  1356    PT Time Calculation (min)  38 min    Activity Tolerance  Patient tolerated treatment well    Behavior During Therapy  [facility] for tasks assessed/performed       Past Medical History:  Diagnosis Date  . Allergy   . Asthma   . Borderline high cholesterol 09/04/2017   10-yr ASCVD risk 4.9%  . Colon polyps   . Former smoker   . Hypertension   . Obesity   . OSA (obstructive sleep apnea)   . Venous insufficiency of both lower extremities 05/01/2018  . Venous ulcer, limited to breakdown of skin ([place]) 05/01/2018    Past Surgical History:  Procedure Laterality Date  . COLONOSCOPY W/ POLYPECTOMY    . KNEE ARTHROSCOPY Right     There were no vitals filed for this visit.  Subjective Assessment - 06/18/19 1321    Subjective  Pt reporting some increased numbness along Rt thumb and 1st finger when driving (resting on armrest). Pain has improved greatly in neck.  Pt reporting 70% improvement since starting therapy.    Pertinent History  HTN; Rt knee sugery at 53 yr old; arthritis; obesity; asthma; Rt shoulder pain for ~ 15 yrs    Patient Stated Goals  get rid of the pain in the neck and shoulder; get active again    Currently in Pain?  Yes    Pain Score  3     Pain Location  Shoulder    Pain Orientation  Right    Pain Descriptors / Indicators  Sore;Aching    Aggravating Factors   ?    Pain Relieving Factors  ?         [facility] PT Assessment - 06/18/19 0001      Assessment   Medical Diagnosis   Rt cervical and shoulder pain     Referring Provider (PT)  Dr Lynne Leader     Onset Date/Surgical Date  02/23/19   pain in the shoulder for 15 yrs    Hand Dominance  Right    Next MD Visit  PRN     Prior Therapy  for knee; maybe shoulder        [facility] Adult PT Treatment/Exercise - 06/18/19 0001      Neck Exercises: Machines for Strengthening   UBE (Upper Arm Bike)  L4 x 1.5 min backward, 1 min forward (wrist began tingling with forward motion)      Shoulder Exercises: Standing   External Rotation  Both;10 reps;Theraband    Theraband Level (Shoulder External Rotation)  Level 3 (Green)    Extension  Strengthening;Both;10 reps;Theraband   3 sec hold    Theraband Level (Shoulder Extension)  Level 3 (Green)    Row  Both;10 reps;Theraband   3 sec hold in retraction   Theraband Level (Shoulder Row)  Level 3 (Green)    Other Standing Exercises  chin tucks x 3 sec x 5  reps (reviewed on form, no pain);  reviewed L's with scap retraction - 2 reps performed      Shoulder Exercises: ROM/Strengthening   UBE (Upper Arm Bike)  L4: 1.5 min forward, 1 min backward.       Shoulder Exercises: Stretch   Other Shoulder Stretches  midlevel doorway stretch x 45 sec x 2 reps, bilat bicep stretch x 30 sec (increased tingling in Rt wrist and thumb); trial of Rt bicep stretch holding counter - increased tingling in Rt wrist (stopped);  bilat shoulder ext AAROM with cane behind back x 10 reps of 3 sec hold    Other Shoulder Stretches  Rt tricep stretch with wall assist x 20 sec x 2 reps; bilat wrst flexor stretch with hands on table, leaning forward x 15 sec x 3 reps; Rt wrist extensor stretch x 3 reps of 15 sec;  Rt thumb stretch with ulnar deviation x 5 sec x 2 reps       Manual Therapy   Soft tissue mobilization  IASTM to Rt ant shoulder, distal Rt tricep, lateral Rt forearm musculature to anatomical snuffbox- to decrease fascial restrictions, STM to same area.                   PT Long Term  Goals - 06/18/19 1518      PT LONG TERM GOAL #1   Title  Improve postue and alignment with patient to demonstrate improve upright posture with posterior shoulder girdle engaged    Status  On-going      PT LONG TERM GOAL #2   Title  Increase cervical ROM to WFL's and painfree in all planes of motion    Baseline  mild decrease with right rotation    Status  Achieved      PT LONG TERM GOAL #3   Title  Patient reports pain free Rt shoulder/UE function for all ADL's    Time  6    Period  Weeks    Status  Partially Met      PT LONG TERM GOAL #4   Title  Maintain to improve 24% limitation score for FOTO    Status  On-going      PT LONG TERM GOAL #5   Title  Independent in HEP    Status  Partially Met            Plan - 06/18/19 1358    Clinical Impression Statement  Pt reporting tingling in Rt lateral wrist/thumb with certain positions of Rt shoulder and palpation to Rt bicep tendon and flexor pollicis longus. Tolerated increased resistance with shoulder exercises without difficulty.  Some improvement in Rt shoulder pain during session with exercises and posture. Pt progressing well towards remaining goals.    Rehab Potential  Good    PT Frequency  2x / week    PT Duration  6 weeks    PT Treatment/Interventions  Patient/family education;ADLs/Self Care Home Management;Cryotherapy;Electrical Stimulation;Iontophoresis 70m/ml Dexamethasone;Moist Heat;Ultrasound;Traction;Manual techniques;Dry needling;Neuromuscular re-education;Functional mobility training;Therapeutic activities;Therapeutic exercise    PT Next Visit Plan  End of POC; FOTO and assess for recert vs d/c vs hold.  DN along deep front arm line.    PT Home Exercise Plan  [email or web address]      Patient will benefit from skilled therapeutic intervention in order to improve the following deficits and impairments:  Pain, Increased fascial restricitons, Increased muscle spasms, Decreased strength, Decreased mobility, Decreased range of  motion, Decreased activity tolerance  Visit Diagnosis: Cervicalgia  Acute pain of right shoulder  Other symptoms and signs involving the musculoskeletal system  Abnormal posture     Problem List Patient Active Problem List   Diagnosis Date Noted  . Venous insufficiency of both lower extremities 05/01/2018  . Borderline high cholesterol 09/04/2017  . Former heavy tobacco smoker 09/03/2017  . Class 3 severe obesity due to excess calories with serious comorbidity and body mass index (BMI) of 50.0 to 59.9 in adult ([place]) 09/03/2017  . Mild persistent asthma without complication 49/35/5217  . Allergic state 08/04/2014  . Uncontrolled stage 2 hypertension 08/04/2014  . OSA on CPAP 08/04/2014   Kerin Perna, PTA 06/18/19 3:50 PM  [facility] [place], [place], [address] Phone: [phone]   Fax:  [phone]  Name: Jordan Moody MRN: [id number] Date of Birth: Oct 05, 1965

## 2019-06-23 ENCOUNTER — Encounter: Payer: Self-pay | Admitting: Physical Therapy

## 2019-06-23 ENCOUNTER — Other Ambulatory Visit: Payer: Self-pay

## 2019-06-23 ENCOUNTER — Ambulatory Visit (INDEPENDENT_AMBULATORY_CARE_PROVIDER_SITE_OTHER): Payer: Managed Care, Other (non HMO) | Admitting: Physical Therapy

## 2019-06-23 DIAGNOSIS — M25511 Pain in right shoulder: Secondary | ICD-10-CM

## 2019-06-23 DIAGNOSIS — M542 Cervicalgia: Secondary | ICD-10-CM | POA: Diagnosis not present

## 2019-06-23 DIAGNOSIS — R29898 Other symptoms and signs involving the musculoskeletal system: Secondary | ICD-10-CM | POA: Diagnosis not present

## 2019-06-23 NOTE — Therapy (Addendum)
[place], [place], [address] Phone: [phone]   Fax:  [phone]  Physical Therapy Treatment  Patient Details  Name: Jordan Moody MRN: [id number] Date of Birth: 28-Jan-1966 Referring Provider (PT): Dr Lynne Leader    Encounter Date: 06/23/2019  PT End of Session - 06/23/19 0804    Visit Number  11    Number of Visits  12    Date for PT Re-Evaluation  06/24/19    PT Start Time  0804    PT Stop Time  0846    PT Time Calculation (min)  42 min    Activity Tolerance  Patient tolerated treatment well    Behavior During Therapy  [facility] for tasks assessed/performed       Past Medical History:  Diagnosis Date  . Allergy   . Asthma   . Borderline high cholesterol 09/04/2017   10-yr ASCVD risk 4.9%  . Colon polyps   . Former smoker   . Hypertension   . Obesity   . OSA (obstructive sleep apnea)   . Venous insufficiency of both lower extremities 05/01/2018  . Venous ulcer, limited to breakdown of skin ([place]) 05/01/2018    Past Surgical History:  Procedure Laterality Date  . COLONOSCOPY W/ POLYPECTOMY    . KNEE ARTHROSCOPY Right     There were no vitals filed for this visit.  Subjective Assessment - 06/23/19 0821    Subjective  Average pain level is 2-3/10 when acting up. He does not have days with no pain.    Pertinent History  HTN; Rt knee sugery at 53 yr old; arthritis; obesity; asthma; Rt shoulder pain for ~ 15 yrs    Patient Stated Goals  get rid of the pain in the neck and shoulder; get active again    Currently in Pain?  Yes    Pain Score  2     Pain Location  Elbow    Pain Orientation  Right    Pain Descriptors / Indicators  Sore    Pain Type  Acute pain                       [facility] Adult PT Treatment/Exercise - 06/23/19 0001      Neck Exercises: Standing   Neck Retraction  5 reps    Neck Retraction Limitations  with extension 5 sec hold    Other Standing Exercises   median and ulnar nerve tension x 2 ea 5 sec hold      Shoulder Exercises: Standing   External Rotation  Both;Theraband;20 reps    Theraband Level (Shoulder External Rotation)  Level 3 (Green)    External Rotation Limitations  started hurting shoulder during 2nd set; advised to use red at home    Extension  Strengthening;Both;Theraband;20 reps   3 sec hold    Theraband Level (Shoulder Extension)  Level 3 (Green)    Row  Both;Theraband;20 reps   3 sec hold in retraction   Theraband Level (Shoulder Row)  Level 3 (Green)      Shoulder Exercises: Stretch   Other Shoulder Stretches  mid and high doorway stretch x 10 sec for review      Manual Therapy   Manual Therapy  Soft tissue mobilization    Soft tissue mobilization  STM to wrist extensiors and flexors; IASTM to wrist flexors also       Trigger Point Dry Needling - 06/23/19 0001  Consent Given?  Yes    Education Handout Provided  Previously provided    Muscles Treated Wrist/Hand  Flexor carpi radialis;Pronator teres;Extensor carpi radialis longus/brevis;Extensor digitorum    Flexor carpi radialis Response  Twitch response elicited;Palpable increased muscle length    Pronator teres Response  Twitch response elicited;Palpable increased muscle length    Extensor carpi radialis longus/brevis Response  Twitch response elicited;Palpable increased muscle length    Extensor digitorum Response  Twitch response elicited;Palpable increased muscle length           PT Education - 06/23/19 1358    Education Details  HEP reviewed for d/c    Person(s) Educated  Patient    Methods  Explanation;Demonstration    Comprehension  Verbalized understanding;Returned demonstration          PT Long Term Goals - 06/23/19 1407      PT LONG TERM GOAL #1   Title  Improve postue and alignment with patient to demonstrate improve upright posture with posterior shoulder girdle engaged    Status  Achieved      PT LONG TERM GOAL #2   Title   Increase cervical ROM to WFL's and painfree in all planes of motion    Status  Achieved      PT LONG TERM GOAL #3   Title  Patient reports pain free Rt shoulder/UE function for all ADL's    Status  Partially Met      PT LONG TERM GOAL #4   Title  Maintain to improve 24% limitation score for FOTO    Baseline  26% limited, however pt pleased with progress    Status  Not Met      PT LONG TERM GOAL #5   Title  Independent in HEP    Status  Achieved            Plan - 06/23/19 1408    Clinical Impression Statement  Patient presents with c/o of 2/10 pain in left elbow today. Overall he is pleased with his progress although he reports he does have some pain daily. At worst his pain level is 2-3/10. HEP was reviewed and modified. He still has +neural tension with median and ulnar nerve glides and has significant tone in left forearm. The latter responded very well to DN today with noticeable decrease in tension. Plan to put pt on hold for 2 weeks and then d/c if no return.    PT Treatment/Interventions  Patient/family education;ADLs/Self Care Home Management;Cryotherapy;Electrical Stimulation;Iontophoresis 11m/ml Dexamethasone;Moist Heat;Ultrasound;Traction;Manual techniques;Dry needling;Neuromuscular re-education;Functional mobility training;Therapeutic activities;Therapeutic exercise    PT Next Visit Plan  Hold until 07/07/19, then d/c if pt does not return.    PT Home Exercise Plan  [email or web address]   Consulted and Agree with Plan of Care  Patient       Patient will benefit from skilled therapeutic intervention in order to improve the following deficits and impairments:  Pain, Increased fascial restricitons, Increased muscle spasms, Decreased strength, Decreased mobility, Decreased range of motion, Decreased activity tolerance  Visit Diagnosis: Cervicalgia  Acute pain of right shoulder  Other symptoms and signs involving the musculoskeletal system     Problem List Patient Active  Problem List   Diagnosis Date Noted  . Venous insufficiency of both lower extremities 05/01/2018  . Borderline high cholesterol 09/04/2017  . Former heavy tobacco smoker 09/03/2017  . Class 3 severe obesity due to excess calories with serious comorbidity and body mass index (BMI) of 50.0 to 59.9 in adult ([facility]  09/03/2017  . Mild persistent asthma without complication 63/49/4944  . Allergic state 08/04/2014  . Uncontrolled stage 2 hypertension 08/04/2014  . OSA on CPAP 08/04/2014    Madelyn Flavors PT 06/23/2019, 2:14 PM  [facility] [place], [place], [address] Phone: [phone]   Fax:  [phone]  Name: Jordan Moody MRN: [id number] Date of Birth: 1965-12-10  PHYSICAL THERAPY DISCHARGE SUMMARY  Visits from Start of Care:11  Current functional level related to goals / functional outcomes: SEE ABOVE   Remaining deficits: SEE ABOVE   Education / Equipment: HEP Plan: Patient agrees to discharge.  Patient goals were partially met. Patient is being discharged due to being pleased with the current functional level.  ?????    Madelyn Flavors, PT 07/12/19 8:43 PM  [facility] at [place], [place] [address]  [phone] (office) [phone] (fax)

## 2019-06-23 NOTE — Patient Instructions (Signed)
Access Code: [email or web address]  URL: [email or web address]  Date: 06/23/2019  Prepared by: Almyra Free Thermon Zulauf   Exercises Seated Cervical Retraction - 10 reps - 1 sets - 3x daily - 7x weekly Standing Scapular Retraction - 10 reps - 1 sets - 10 hold - 3x daily                            - 7x weekly Shoulder External Rotation and Scapular Retraction - 10 reps - 1 sets - hold - 3x daily - 7x weekly Doorway Pec Stretch at 90 Degrees Abduction - 3 reps - 1 sets - 30 seconds hold - 3x daily - 7x weekly Doorway Pec Stretch at 120 Degrees Abduction - 3 reps - 1 sets - 30 second hold hold - 3x daily - 7x weekly Ulnar Nerve Tensioner - 10 reps - 1 sets - 5-10 sec hold - 1x daily - 7x weekly Standing Median Nerve Glide - 10 reps - 1 sets - 5-10 sec hold - 1x daily - 7x weekly Shoulder External Rotation with Resistance - 10 reps - 3 sets - 1x daily - 7x weekly Standing Shoulder Row with Anchored Resistance - 10 reps - 3 sets - 1x daily - 7x weekly Shoulder extension with resistance - Neutral - 10 reps - 3 sets - 1x daily - 7x weekly Standing Wrist Extension Stretch - 3 reps - 1 sets - 30 sec hold - 2x daily - 7x weekly Standing Wrist Flexion Stretch - 3 reps - 1 sets - 30 sec hold - 2x daily - 7x weekly Patient Education Trigger Point Dry Needling

## 2019-06-24 ENCOUNTER — Encounter: Payer: Managed Care, Other (non HMO) | Admitting: Rehabilitative and Restorative Service Providers"

## 2019-06-28 ENCOUNTER — Encounter: Payer: Managed Care, Other (non HMO) | Admitting: Physical Therapy

## 2019-07-01 ENCOUNTER — Encounter: Payer: Managed Care, Other (non HMO) | Admitting: Physical Therapy

## 2019-07-06 ENCOUNTER — Other Ambulatory Visit: Payer: Self-pay | Admitting: Physician Assistant

## 2019-07-06 DIAGNOSIS — I10 Essential (primary) hypertension: Secondary | ICD-10-CM

## 2019-07-12 ENCOUNTER — Other Ambulatory Visit: Payer: Self-pay | Admitting: Physician Assistant

## 2019-07-12 NOTE — Telephone Encounter (Signed)
Patient called, stating that he needs a refill on medication listed below:  ADVAIR DISKUS 100-50 MCG/DOSE AEPB [[id number]]   olmesartan-hydrochlorothiazide (BENICAR HCT) 40-25 MG tablet [[id number]]

## 2019-07-13 ENCOUNTER — Other Ambulatory Visit: Payer: Self-pay

## 2019-07-13 ENCOUNTER — Ambulatory Visit (INDEPENDENT_AMBULATORY_CARE_PROVIDER_SITE_OTHER): Payer: Managed Care, Other (non HMO)

## 2019-07-13 ENCOUNTER — Ambulatory Visit (INDEPENDENT_AMBULATORY_CARE_PROVIDER_SITE_OTHER): Payer: Managed Care, Other (non HMO) | Admitting: Physician Assistant

## 2019-07-13 VITALS — BP 153/76 | HR 70 | Ht 73.0 in | Wt >= 6400 oz

## 2019-07-13 DIAGNOSIS — J4530 Mild persistent asthma, uncomplicated: Secondary | ICD-10-CM | POA: Diagnosis not present

## 2019-07-13 DIAGNOSIS — M549 Dorsalgia, unspecified: Secondary | ICD-10-CM

## 2019-07-13 DIAGNOSIS — M5410 Radiculopathy, site unspecified: Secondary | ICD-10-CM | POA: Diagnosis not present

## 2019-07-13 DIAGNOSIS — M25511 Pain in right shoulder: Secondary | ICD-10-CM

## 2019-07-13 DIAGNOSIS — I10 Essential (primary) hypertension: Secondary | ICD-10-CM

## 2019-07-13 DIAGNOSIS — R202 Paresthesia of skin: Secondary | ICD-10-CM

## 2019-07-13 DIAGNOSIS — R200 Anesthesia of skin: Secondary | ICD-10-CM

## 2019-07-13 MED ORDER — ADVAIR DISKUS 100-50 MCG/DOSE IN AEPB: 1.0000 | INHALATION_SPRAY | Freq: Two times a day (BID) | RESPIRATORY_TRACT | 2 refills | Status: AC

## 2019-07-13 MED ORDER — OLMESARTAN MEDOXOMIL-HCTZ 40-25 MG PO TABS: 1.0000 | ORAL_TABLET | Freq: Every day | ORAL | 0 refills | Status: DC

## 2019-07-13 NOTE — Progress Notes (Signed)
Subjective:    Patient ID: Jordan Moody, male    DOB: 12-Nov-1965, 53 y.o.   MRN: [id number]  HPI  Numbness start start after saw corey.month  PT-needs new order tens until exercise. Numbness thumb and index/middle.   Per patient does not usually have BP issues but thinks it is due to his right upper back/shoulder/arm pain. Pt is not checking at home. No CP, palpitations, headaches, or vision changes.   .. Active Ambulatory Problems    Diagnosis Date Noted  . Allergic state 08/04/2014  . Hypertension goal BP (blood pressure) < 130/80 08/04/2014  . OSA on CPAP 08/04/2014  . Former heavy tobacco smoker 09/03/2017  . Class 3 severe obesity due to excess calories with serious comorbidity and body mass index (BMI) of 50.0 to 59.9 in adult ([place]) 09/03/2017  . Mild persistent asthma without complication 63/87/5643  . Borderline high cholesterol 09/04/2017  . Venous insufficiency of both lower extremities 05/01/2018  . Numbness and tingling of right arm 07/17/2019  . Upper back pain on right side 07/19/2019  . Acute pain of right shoulder 07/19/2019   Resolved Ambulatory Problems    Diagnosis Date Noted  . Asthma without status asthmaticus 08/04/2014  . Venous ulcer, limited to breakdown of skin ([place]) 05/01/2018   Past Medical History:  Diagnosis Date  . Allergy   . Asthma   . Colon polyps   . Former smoker   . Hypertension   . Obesity   . OSA (obstructive sleep apnea)      Review of Systems See HPI.     Objective:   Physical Exam Vitals signs reviewed.  Constitutional:      Appearance: Normal appearance. He is obese.  HENT:     Head: Normocephalic.  Cardiovascular:     Rate and Rhythm: Normal rate and regular rhythm.  Pulmonary:     Effort: Pulmonary effort is normal.     Breath sounds: Normal breath sounds.  Musculoskeletal:     Comments: No tenderness over c-spine.  Tightness with ROM of neck more to the left.  NROM of right shoulder.  Numbness and tingling  in thumb, index, middle.  Negative drop arm sign.  Tenderness over supraspinatus.   Neurological:     General: No focal deficit present.     Mental Status: He is alert and oriented to person, place, and time.  Psychiatric:        Mood and Affect: Mood normal.           Assessment & Plan:  Marland KitchenMarland KitchenAras was seen today for hypertension.  Diagnoses and all orders for this visit:  Numbness and tingling of right arm -     DG Cervical Spine Complete  Hypertension goal BP (blood pressure) < 130/80 -     olmesartan-hydrochlorothiazide (BENICAR HCT) 40-25 MG tablet; Take 1 tablet by mouth daily.  Mild persistent asthma without complication -     ADVAIR DISKUS 100-50 MCG/DOSE AEPB; Inhale 1 puff into the lungs 2 (two) times daily.  Acute pain of right shoulder  Upper back pain on right side   BP not to goal. Pt is in pain today. Recheck in 4 weeks.   Refilled advair.   Dr. Georgina Snell injected right shoulder due to mild AC joint degeneration. No real improvement. complains of more upper back or supraspinatus pain. No signs of rotator cuff complete tear possible tendonitis. Concern with radiculopathy symptoms will get xray of neck as well to look for any disc causes  of pain. Will resend PT order to focus more on shoulder/rotator cuff. May need MRI if not improving.

## 2019-07-14 NOTE — Progress Notes (Signed)
Kumar,   Fortunately your neck xrays look good. I do not think numbness and tingling is coming from neck. Seems more likely that your upper right back muscles are impinging on the nerve due to tightness etc. You can stop neck exercises but keep upper back and shoulder. I will place PT for upper back.   -Luvenia Starch

## 2019-07-17 ENCOUNTER — Encounter: Payer: Self-pay | Admitting: Physician Assistant

## 2019-07-17 DIAGNOSIS — R202 Paresthesia of skin: Secondary | ICD-10-CM | POA: Insufficient documentation

## 2019-07-17 DIAGNOSIS — R200 Anesthesia of skin: Secondary | ICD-10-CM | POA: Insufficient documentation

## 2019-07-19 DIAGNOSIS — M25511 Pain in right shoulder: Secondary | ICD-10-CM | POA: Insufficient documentation

## 2019-07-19 DIAGNOSIS — M549 Dorsalgia, unspecified: Secondary | ICD-10-CM | POA: Insufficient documentation

## 2019-07-23 ENCOUNTER — Other Ambulatory Visit: Payer: Self-pay

## 2019-07-23 ENCOUNTER — Ambulatory Visit (INDEPENDENT_AMBULATORY_CARE_PROVIDER_SITE_OTHER): Payer: Managed Care, Other (non HMO) | Admitting: Physical Therapy

## 2019-07-23 DIAGNOSIS — M25511 Pain in right shoulder: Secondary | ICD-10-CM

## 2019-07-23 DIAGNOSIS — M6281 Muscle weakness (generalized): Secondary | ICD-10-CM

## 2019-07-23 DIAGNOSIS — R29898 Other symptoms and signs involving the musculoskeletal system: Secondary | ICD-10-CM

## 2019-07-23 NOTE — Patient Instructions (Signed)
Access Code: [email or web address]  URL: [email or web address]  Date: 07/23/2019  Prepared by: Almyra Free Jenesys Casseus   Exercises Seated Cervical Retraction - 10 reps - 1 sets - 3x daily - 7x weekly Standing Scapular Retraction - 10 reps - 1 sets - 10 hold - 3x daily                            - 7x weekly Shoulder External Rotation and Scapular Retraction - 10 reps - 1 sets - hold - 3x daily - 7x weekly Doorway Pec Stretch at 90 Degrees Abduction - 3 reps - 1 sets - 30 seconds hold - 3x daily - 7x weekly Doorway Pec Stretch at 120 Degrees Abduction - 3 reps - 1 sets - 30 second hold hold - 3x daily - 7x weekly Ulnar Nerve Tensioner - 10 reps - 1 sets - 5-10 sec hold - 1x daily - 7x weekly Standing Median Nerve Glide - 10 reps - 1 sets - 5-10 sec hold - 1x daily - 7x weekly Shoulder External Rotation with Resistance - 10 reps - 3 sets - 1x daily - 7x weekly Standing Shoulder Row with Anchored Resistance - 10 reps - 3 sets - 1x daily - 7x weekly Shoulder extension with resistance - Neutral - 10 reps - 3 sets - 1x daily - 7x weekly Standing Wrist Extension Stretch - 3 reps - 1 sets - 30 sec hold - 2x daily - 7x weekly Standing Wrist Flexion Stretch - 3 reps - 1 sets - 30 sec hold - 2x daily - 7x weekly Standing Radial Nerve Glide - 10 reps - 1 sets - 5 sec on/5 sec off hold - 2x daily - 7x weekly Patient Education Trigger Point Dry Needling

## 2019-07-23 NOTE — Therapy (Signed)
[place], [place], [address] Phone: [phone]   Fax:  [phone]  Physical Therapy Treatment  Patient Details  Name: Jordan Moody MRN: [id number] Date of Birth: 24-Mar-1966 Referring Provider (PT): Iran Planas   Encounter Date: 07/23/2019  PT End of Session - 07/23/19 0848    Visit Number  12    Number of Visits  24    Date for PT Re-Evaluation  09/03/19    PT Start Time  0846    PT Stop Time  0930    PT Time Calculation (min)  44 min    Activity Tolerance  Patient tolerated treatment well    Behavior During Therapy  [facility] for tasks assessed/performed       Past Medical History:  Diagnosis Date  . Allergy   . Asthma   . Borderline high cholesterol 09/04/2017   10-yr ASCVD risk 4.9%  . Colon polyps   . Former smoker   . Hypertension   . Obesity   . OSA (obstructive sleep apnea)   . Venous insufficiency of both lower extremities 05/01/2018  . Venous ulcer, limited to breakdown of skin ([place]) 05/01/2018    Past Surgical History:  Procedure Laterality Date  . COLONOSCOPY W/ POLYPECTOMY    . KNEE ARTHROSCOPY Right     There were no vitals filed for this visit.  Subjective Assessment - 07/23/19 0849    Subjective  Patient went to the beach in July and hurt his shoulder hauling everything to the beach everyday. He has long h/o shoulder pain. Pain in neck is better but stil there somewhat. He says he hasn't been fully compliant with HEP but does so postural exercises at his desk. Pain is across the top of the shoulder and upper arm with intermittent numbness to right thumb and occassionally on lateral forearm. Pain is not keeping him up at night.    Pertinent History  HTN; Rt knee sugery at 53 yr old; arthritis; obesity; asthma; Rt shoulder pain for ~ 15 yrs    Patient Stated Goals  get rid of pain  in shoulder and get rid of numbness    Currently in Pain?  Yes    Pain Score  4     Pain  Location  Shoulder    Pain Orientation  Right    Pain Descriptors / Indicators  Numbness    Pain Type  Acute pain    Pain Onset  More than a month ago    Pain Frequency  Intermittent    Aggravating Factors   at end of day once he sits down to relax    Pain Relieving Factors  good posture         [facility] PT Assessment - 07/23/19 0001      Assessment   Medical Diagnosis  N/T Rt arm; acute pain Rt shoulder, upper back pain    Referring Provider (PT)  Iran Planas    Onset Date/Surgical Date  02/23/19    Hand Dominance  Right    Prior Therapy  for neck      Balance Screen   Has the patient fallen in the past 6 months  No    Has the patient had a decrease in activity level because of a fear of falling?   No    Is the patient reluctant to leave their home because of a fear of falling?   No      Prior Function  Level of Independence  Independent    Vocation Requirements  sitting at desk      Posture/Postural Control   Posture Comments  improved posture demonstrated in clinic      ROM / Strength   AROM / PROM / Strength  AROM;Strength      AROM   AROM Assessment Site  Shoulder    Right/Left Shoulder  Right    Right Shoulder Flexion  155 Degrees   168 passive   Right Shoulder ABduction  145 Degrees   160 passive with pain   Right Shoulder Internal Rotation  74 Degrees   84 passive; reports numness into thumb   Right Shoulder External Rotation  80 Degrees   passive 87 with pain     Strength   Overall Strength Comments  mid trap 4+/5, low trap 4-/5, rhoboids 5/5    Strength Assessment Site  Shoulder    Right/Left Shoulder  Right    Right Shoulder Flexion  5/5    Right Shoulder Extension  5/5    Right Shoulder ABduction  4+/5   weak   Right Shoulder Internal Rotation  5/5    Right Shoulder External Rotation  5/5      Palpation   Spinal mobility  mild stiffness mid thoracic    Palpation comment  tender in upper arm      Special Tests   Other special tests  +radial  ULTT                   [facility] Adult PT Treatment/Exercise - 07/23/19 0001      Neck Exercises: Standing   Other Standing Exercises  radial ulnar nerve glide 10 x 5sec on/off      Manual Therapy   Manual Therapy  Soft tissue mobilization    Soft tissue mobilization  IASTM to post upper arm and forearm             PT Education - 07/23/19 1315    Education Details  Reviewed HEP and progressed    Person(s) Educated  Patient    Methods  Explanation;Demonstration;Handout    Comprehension  Verbalized understanding;Returned demonstration          PT Long Term Goals - 07/23/19 1324      PT LONG TERM GOAL #1   Title  Patient to demo negative ULTT in RUE.    Time  6    Period  Weeks    Status  New    Target Date  09/03/19      PT LONG TERM GOAL #2   Title  Patient to report decrease pain in right shoulder by 50% or more with ADLS    Baseline  --    Time  6    Period  Weeks    Status  New      PT LONG TERM GOAL #3   Title  Patient to report 75% decrease in thumb numbness.    Time  6    Period  Weeks    Status  New      PT LONG TERM GOAL #4   Title  Patient to demo 5/5 right shoulder and upper back strength    Baseline  ---    Time  6    Period  Weeks    Status  New            Plan - 07/23/19 1315    Clinical Impression Statement  Patient presents with c/o right shoulder  pain and numbness into RUE. He was seen recently in the clinic for neck pain and had similar nerve sx. He reports continued improvement in neck overall and demonstrates functional ROM. He is limted with left rotation somewhat. He has some Rt shoulder limitations in ROM and mild strength deficits. He continues to have ulnar, median and radial nerve tension. Ulnar has improved the most. He responded well to IASTM and had decreased tension in radial nerve at end of session.    Comorbidities  obesity; HTN    PT Treatment/Interventions  Patient/family education;ADLs/Self Care Home  Management;Cryotherapy;Electrical Stimulation;Iontophoresis 4mg /ml Dexamethasone;Moist Heat;Ultrasound;Traction;Manual techniques;Dry needling;Neuromuscular re-education;Functional mobility training;Therapeutic activities;Therapeutic exercise    PT Next Visit Plan  cont 2x/wk for 6 wks; focus on nerve tension release, shoulder and upper back strengthening; IASTM; DN prn    PT Home Exercise Plan     Consulted and Agree with Plan of Care  Patient       Patient will benefit from skilled therapeutic intervention in order to improve the following deficits and impairments:  Pain, Increased fascial restricitons, Increased muscle spasms, Decreased strength, Decreased mobility, Decreased range of motion, Decreased activity tolerance  Visit Diagnosis: Acute pain of right shoulder - Plan: PT plan of care cert/re-cert  Other symptoms and signs involving the musculoskeletal system - Plan: PT plan of care cert/re-cert  Muscle weakness (generalized) - Plan: PT plan of care cert/re-cert     Problem List Patient Active Problem List   Diagnosis Date Noted  . Upper back pain on right side 07/19/2019  . Acute pain of right shoulder 07/19/2019  . Numbness and tingling of right arm 07/17/2019  . Venous insufficiency of both lower extremities 05/01/2018  . Borderline high cholesterol 09/04/2017  . Former heavy tobacco smoker 09/03/2017  . Class 3 severe obesity due to excess calories with serious comorbidity and body mass index (BMI) of 50.0 to 59.9 in adult (HCC) 09/03/2017  . Mild persistent asthma without complication 09/03/2017  . Allergic state 08/04/2014  . Hypertension goal BP (blood pressure) < 130/80 08/04/2014  . OSA on CPAP 08/04/2014    08/06/2014 PT 07/23/2019, 1:37 PM  [facility] [address], [place], [place] Phone: [phone]   Fax:  [phone]  Name: Jordan Moody MRN: Lucina Mellow Date of Birth:  10-05-1965

## 2019-07-27 ENCOUNTER — Other Ambulatory Visit: Payer: Self-pay

## 2019-07-27 ENCOUNTER — Ambulatory Visit (INDEPENDENT_AMBULATORY_CARE_PROVIDER_SITE_OTHER): Payer: Managed Care, Other (non HMO) | Admitting: Physical Therapy

## 2019-07-27 ENCOUNTER — Encounter: Payer: Self-pay | Admitting: Physical Therapy

## 2019-07-27 DIAGNOSIS — R29898 Other symptoms and signs involving the musculoskeletal system: Secondary | ICD-10-CM

## 2019-07-27 DIAGNOSIS — M25511 Pain in right shoulder: Secondary | ICD-10-CM | POA: Diagnosis not present

## 2019-07-27 DIAGNOSIS — M6281 Muscle weakness (generalized): Secondary | ICD-10-CM | POA: Diagnosis not present

## 2019-07-27 NOTE — Therapy (Signed)
[place], [place], [address] Phone: [phone]   Fax:  [phone]  Physical Therapy Treatment  Patient Details  Name: Jordan Moody MRN: [id number] Date of Birth: [birth year] Referring Provider (PT): Iran Planas   Encounter Date: 07/27/2019  PT End of Session - 07/27/19 0849    Visit Number  13    Number of Visits  24    Date for PT Re-Evaluation  09/03/19    PT Start Time  0848    PT Stop Time  0928    PT Time Calculation (min)  40 min    Activity Tolerance  Patient tolerated treatment well    Behavior During Therapy  [facility] for tasks assessed/performed       Past Medical History:  Diagnosis Date  . Allergy   . Asthma   . Borderline high cholesterol 09/04/2017   10-yr ASCVD risk 4.9%  . Colon polyps   . Former smoker   . Hypertension   . Obesity   . OSA (obstructive sleep apnea)   . Venous insufficiency of both lower extremities 05/01/2018  . Venous ulcer, limited to breakdown of skin ([place]) 05/01/2018    Past Surgical History:  Procedure Laterality Date  . COLONOSCOPY W/ POLYPECTOMY    . KNEE ARTHROSCOPY Right     There were no vitals filed for this visit.  Subjective Assessment - 07/27/19 0849    Subjective  Felt really good until Sunday. Today having some numbness in thumb.    Pertinent History  HTN; Rt knee sugery at 53 yr old; arthritis; obesity; asthma; Rt shoulder pain for ~ 15 yrs    Patient Stated Goals  get rid of pain  in shoulder and get rid of numbness    Currently in Pain?  No/denies                       [facility] Adult PT Treatment/Exercise - 07/27/19 0001      Exercises   Exercises  Neck;Shoulder      Neck Exercises: Machines for Strengthening   UBE (Upper Arm Bike)  L5 x 4 min alt every 2 min      Neck Exercises: Standing   Other Standing Exercises  radial/ulnar nerve glide 10 x 5sec on/off      Shoulder Exercises: Prone   Retraction  Both;10  reps    Retraction Limitations  goal post difficult    Horizontal ABduction 1  Both;20 reps;Weights    Horizontal ABduction 1 Weight (lbs)  3    Horizontal ABduction 2  Both;20 reps;Weights    Horizontal ABduction 2 Weight (lbs)  3      Shoulder Exercises: Standing   Flexion  Both;20 reps;Theraband    Flexion Limitations  with isometric ABD using yellow band in circle around hands       Shoulder Exercises: Stretch   Other Shoulder Stretches  left over right hand with Rt wrist flex 2x30 sec      Manual Therapy   Manual Therapy  Soft tissue mobilization    Soft tissue mobilization  IASTM to post upper arm, forearm and pectorals                  PT Long Term Goals - 07/23/19 1324      PT LONG TERM GOAL #1   Title  Patient to demo negative ULTT in RUE.    Time  6    Period  Weeks    Status  New    Target Date  09/03/19      PT LONG TERM GOAL #2   Title  Patient to report decrease pain in right shoulder by 50% or more with ADLS    Baseline  --    Time  6    Period  Weeks    Status  New      PT LONG TERM GOAL #3   Title  Patient to report 75% decrease in thumb numbness.    Time  6    Period  Weeks    Status  New      PT LONG TERM GOAL #4   Title  Patient to demo 5/5 right shoulder and upper back strength    Baseline  ---    Time  6    Period  Weeks    Status  New            Plan - 07/27/19 1226    Clinical Impression Statement  Patient reporting significant improvement after last visit with only reports of some numbness in his right thumb today. He did well with strengthening and reported no numbness in thumb after manual therapy. No goals met as only second visit.    PT Frequency  2x / week    PT Duration  6 weeks    PT Treatment/Interventions  Patient/family education;ADLs/Self Care Home Management;Cryotherapy;Electrical Stimulation;Iontophoresis 32m/ml Dexamethasone;Moist Heat;Ultrasound;Traction;Manual techniques;Dry needling;Neuromuscular  re-education;Functional mobility training;Therapeutic activities;Therapeutic exercise    PT Next Visit Plan  focus on nerve tension release, shoulder and upper back strengthening; IASTM; DN prn    PT Home Exercise Plan  [id number]      Patient will benefit from skilled therapeutic intervention in order to improve the following deficits and impairments:  Pain, Increased fascial restricitons, Increased muscle spasms, Decreased strength, Decreased mobility, Decreased range of motion, Decreased activity tolerance  Visit Diagnosis: Acute pain of right shoulder  Other symptoms and signs involving the musculoskeletal system  Muscle weakness (generalized)     Problem List Patient Active Problem List   Diagnosis Date Noted  . Upper back pain on right side 07/19/2019  . Acute pain of right shoulder 07/19/2019  . Numbness and tingling of right arm 07/17/2019  . Venous insufficiency of both lower extremities 05/01/2018  . Borderline high cholesterol 09/04/2017  . Former heavy tobacco smoker 09/03/2017  . Class 3 severe obesity due to excess calories with serious comorbidity and body mass index (BMI) of 50.0 to 59.9 in adult ([place] 09/03/2017  . Mild persistent asthma without complication 53/49/9718 . Allergic state 08/04/2014  . Hypertension goal BP (blood pressure) < 130/80 08/04/2014  . OSA on CPAP 08/04/2014    JMadelyn FlavorsPT 07/27/2019, 12:29 PM  [facility] [place] [address]Phone: [phone]  Fax:  [phone] Name: BJAVARIS WIGINGTONMRN: [id number] of Birth: 909-May-1967

## 2019-07-29 ENCOUNTER — Ambulatory Visit (INDEPENDENT_AMBULATORY_CARE_PROVIDER_SITE_OTHER): Payer: Managed Care, Other (non HMO) | Admitting: Physical Therapy

## 2019-07-29 ENCOUNTER — Other Ambulatory Visit: Payer: Self-pay

## 2019-07-29 ENCOUNTER — Encounter: Payer: Self-pay | Admitting: Physical Therapy

## 2019-07-29 DIAGNOSIS — M6281 Muscle weakness (generalized): Secondary | ICD-10-CM

## 2019-07-29 DIAGNOSIS — R29898 Other symptoms and signs involving the musculoskeletal system: Secondary | ICD-10-CM | POA: Diagnosis not present

## 2019-07-29 DIAGNOSIS — M25511 Pain in right shoulder: Secondary | ICD-10-CM | POA: Diagnosis not present

## 2019-07-29 DIAGNOSIS — R293 Abnormal posture: Secondary | ICD-10-CM

## 2019-07-29 NOTE — Therapy (Signed)
[facility] [place] [address] [place], [place], [address] Phone: [phone]   Fax:  [phone]  Physical Therapy Treatment  Patient Details  Name: Jordan Moody MRN: [id number] Date of Birth: 03-15-1966 Referring Provider (PT): Tandy Gaw   Encounter Date: 07/29/2019  PT End of Session - 07/29/19 0909    Visit Number  14    Number of Visits  24    Date for PT Re-Evaluation  09/03/19    PT Start Time  0850    PT Stop Time  0923 pt requests to leave early for meeting   PT Time Calculation (min)  33 min       Past Medical History:  Diagnosis Date  . Allergy   . Asthma   . Borderline high cholesterol 09/04/2017   10-yr ASCVD risk 4.9%  . Colon polyps   . Former smoker   . Hypertension   . Obesity   . OSA (obstructive sleep apnea)   . Venous insufficiency of both lower extremities 05/01/2018  . Venous ulcer, limited to breakdown of skin (HCC) 05/01/2018    Past Surgical History:  Procedure Laterality Date  . COLONOSCOPY W/ POLYPECTOMY    . KNEE ARTHROSCOPY Right     There were no vitals filed for this visit.  Subjective Assessment - 07/29/19 0850    Subjective  Pt report things have improved a little bit since last visit. He has been more compliant with HEP last 2 wks.    Pertinent History  HTN; Rt knee sugery at 53 yr old; arthritis; obesity; asthma; Rt shoulder pain for ~ 15 yrs    Patient Stated Goals  get rid of pain  in shoulder and get rid of numbness    Currently in Pain?  Yes    Pain Score  2     Pain Location  Shoulder    Pain Orientation  Right    Aggravating Factors   if sitting in hunched position at end of day.    Pain Relieving Factors  good posture         [facility] PT Assessment - 07/29/19 0001      Assessment   Medical Diagnosis  N/T Rt arm; acute pain Rt shoulder, upper back pain    Referring Provider (PT)  Tandy Gaw    Onset Date/Surgical Date  02/23/19    Hand Dominance  Right    Prior Therapy  for neck       \  [facility] Adult PT Treatment/Exercise - 07/29/19 0001      Neck Exercises: Machines for Strengthening   Nustep  L5: arms only (feet on ground on blue pads) x 4 min       Shoulder Exercises: Prone   Retraction  Both;10 reps   3 sec pause   Horizontal ABduction 1  Both;10 reps;Strengthening;Weights   2 sets (T's)   Horizontal ABduction 1 Weight (lbs)  3    Horizontal ABduction 2  Strengthening;Both;10 reps;Weights   Y's    Horizontal ABduction 2 Weight (lbs)  3    Other Prone Exercises  goal posts, no weight x 10      Shoulder Exercises: Standing   ABduction  Strengthening;Right;10 reps;Weights;Left   2 sets on Rt, to 90 deg, eccentric lowering.    Shoulder ABduction Weight (lbs)  3     Extension  Strengthening;Both;10 reps   3 sec hold in retractino   Theraband Level (Shoulder Extension)  Level 3 (Green)  Row  Both;10 reps    Theraband Level (Shoulder Row)  Level 3 (Green)   3 sec hold in retraction     Shoulder Exercises: Stretch   Other Shoulder Stretches  mid-level doorway stretch x 2 reps of 15 sec;  Rt shoulder ext stretch with neural glide x 20 sec x 3 reps       Manual Therapy   Soft tissue mobilization  IASTM to Rt posterior shoulder, tricep, prox bicep, and pectorals to decrease fascial restrictions and improve mobility.              PT Education - 07/29/19 1752    Education Details  HEP ( added standing shoulder abdct with 3# - declined handout for this) and issued green band for row and shoulder ext.    Person(s) Educated  Patient    Methods  Explanation;Demonstration;Tactile cues;Verbal cues    Comprehension  Verbalized understanding;Returned demonstration          PT Long Term Goals - 07/23/19 1324      PT LONG TERM GOAL #1   Title  Patient to demo negative ULTT in RUE.    Time  6    Period  Weeks    Status  New    Target Date  09/03/19      PT LONG TERM GOAL #2   Title  Patient to report decrease pain in  right shoulder by 50% or more with ADLS    Baseline  --    Time  6    Period  Weeks    Status  New      PT LONG TERM GOAL #3   Title  Patient to report 75% decrease in thumb numbness.    Time  6    Period  Weeks    Status  New      PT LONG TERM GOAL #4   Title  Patient to demo 5/5 right shoulder and upper back strength    Baseline  ---    Time  6    Period  Weeks    Status  New            Plan - 07/29/19 1750    Clinical Impression Statement  Pt reporting overall improvement in symptoms with more consistent HEP and postural awareness.  Pt tight and tender in Rt superior pec and bicep tendon with IASTM; reported reduction of symptoms by end of session.  Pt tolerated all new exercises well, however mid-level doorway stretch produced tingling in his Rt thumb after 15 sec.  Making progress towards established goals.    PT Frequency  2x / week    PT Duration  6 weeks    PT Treatment/Interventions  Patient/family education;ADLs/Self Care Home Management;Cryotherapy;Electrical Stimulation;Iontophoresis 4mg /ml Dexamethasone;Moist Heat;Ultrasound;Traction;Manual techniques;Dry needling;Neuromuscular re-education;Functional mobility training;Therapeutic activities;Therapeutic exercise    PT Next Visit Plan  focus on nerve tension release, shoulder and upper back strengthening; IASTM; DN prn    PT Home Exercise Plan  [id number]       Patient will benefit from skilled therapeutic intervention in order to improve the following deficits and impairments:  Pain, Increased fascial restricitons, Increased muscle spasms, Decreased strength, Decreased mobility, Decreased range of motion, Decreased activity tolerance  Visit Diagnosis: Acute pain of right shoulder  Other symptoms and signs involving the musculoskeletal system  Muscle weakness (generalized)  Abnormal posture     Problem List Patient Active Problem List   Diagnosis Date Noted  . Upper back pain on right  side 07/19/2019  .  Acute pain of right shoulder 07/19/2019  . Numbness and tingling of right arm 07/17/2019  . Venous insufficiency of both lower extremities 05/01/2018  . Borderline high cholesterol 09/04/2017  . Former heavy tobacco smoker 09/03/2017  . Class 3 severe obesity due to excess calories with serious comorbidity and body mass index (BMI) of 50.0 to 59.9 in adult ([place]) 09/03/2017  . Mild persistent asthma without complication 93/81/8299  . Allergic state 08/04/2014  . Hypertension goal BP (blood pressure) < 130/80 08/04/2014  . OSA on CPAP 08/04/2014   Kerin Perna, PTA 07/29/19 5:56 PM  [place], [place], [address] Phone: [phone]   Fax:  [phone]  Name: Jordan Moody MRN: [id number] Date of Birth: 29-Jul-1966

## 2019-08-03 ENCOUNTER — Ambulatory Visit (INDEPENDENT_AMBULATORY_CARE_PROVIDER_SITE_OTHER): Payer: Managed Care, Other (non HMO) | Admitting: Physical Therapy

## 2019-08-03 ENCOUNTER — Other Ambulatory Visit: Payer: Self-pay

## 2019-08-03 DIAGNOSIS — R29898 Other symptoms and signs involving the musculoskeletal system: Secondary | ICD-10-CM

## 2019-08-03 DIAGNOSIS — R293 Abnormal posture: Secondary | ICD-10-CM | POA: Diagnosis not present

## 2019-08-03 DIAGNOSIS — M25511 Pain in right shoulder: Secondary | ICD-10-CM

## 2019-08-03 DIAGNOSIS — M6281 Muscle weakness (generalized): Secondary | ICD-10-CM

## 2019-08-03 NOTE — Therapy (Signed)
[place], [place], [address] Phone: [phone]   Fax:  [phone]  Physical Therapy Treatment  Patient Details  Name: Jordan Moody MRN: [id number] Date of Birth: 1965-11-22 Referring Provider (PT): Iran Planas   Encounter Date: 08/03/2019  PT End of Session - 08/03/19 0855    Visit Number  15    Number of Visits  24    Date for PT Re-Evaluation  09/03/19    PT Start Time  0850    PT Stop Time  0931    PT Time Calculation (min)  41 min    Activity Tolerance  Patient tolerated treatment well    Behavior During Therapy  [facility] for tasks assessed/performed       Past Medical History:  Diagnosis Date  . Allergy   . Asthma   . Borderline high cholesterol 09/04/2017   10-yr ASCVD risk 4.9%  . Colon polyps   . Former smoker   . Hypertension   . Obesity   . OSA (obstructive sleep apnea)   . Venous insufficiency of both lower extremities 05/01/2018  . Venous ulcer, limited to breakdown of skin ([place]) 05/01/2018    Past Surgical History:  Procedure Laterality Date  . COLONOSCOPY W/ POLYPECTOMY    . KNEE ARTHROSCOPY Right     There were no vitals filed for this visit.  Subjective Assessment - 08/03/19 0855    Subjective  Feeling better. Just sore in shoulder today. Felt a little bit when I was sitting in softer chair.    Pertinent History  HTN; Rt knee sugery at 53 yr old; arthritis; obesity; asthma; Rt shoulder pain for ~ 15 yrs    Patient Stated Goals  get rid of pain  in shoulder and get rid of numbness    Currently in Pain?  No/denies         [facility] PT Assessment - 08/03/19 0001      Strength   Right Shoulder ABduction  4+/5   with some pain                  [facility] Adult PT Treatment/Exercise - 08/03/19 0001      Exercises   Exercises  Shoulder      Shoulder Exercises: Prone   Horizontal ABduction 1  Both;10 reps;Strengthening;Weights   2 sets (T's)   Horizontal  ABduction 1 Weight (lbs)  3    Horizontal ABduction 2  Strengthening;Both;10 reps;Weights   Y's    Horizontal ABduction 2 Weight (lbs)  3    Other Prone Exercises  goal posts, no weight x 10   3 sec hold     Shoulder Exercises: Standing   ABduction  Strengthening;Right;Weights;Left;20 reps    to 90 deg, eccentric lowering.    Shoulder ABduction Weight (lbs)  3    Other Standing Exercises  scaption and flexion to 90 deg 3# bil x 20 ea      Shoulder Exercises: ROM/Strengthening   Lat Pull  20 reps    Lat Pull Limitations  5 plates    Cybex Row  20 reps    Cybex Row Limitations  4 plates seated in chair      Manual Therapy   Manual Therapy  Soft tissue mobilization    Soft tissue mobilization  to right pectorals with some transfriction massage to ant RC      Neck Exercises: Stretches   Neural Stretch  median nerve glide 5 sec on/off  x 10                  PT Long Term Goals - 08/03/19 0915      PT LONG TERM GOAL #1   Title  Patient to demo negative ULTT in RUE.    Status  On-going      PT LONG TERM GOAL #2   Title  Patient to report decrease pain in right shoulder by 75% or more with ADLS    Baseline  2/3 better 08/03/19    Status  Revised      PT LONG TERM GOAL #3   Title  Patient to report 75% decrease in thumb numbness.    Status  On-going      PT LONG TERM GOAL #4   Title  Patient to demo 5/5 right shoulder and upper back strength    Status  On-going      PT LONG TERM GOAL #5   Title  ---            Plan - 08/03/19 1036    Clinical Impression Statement  Patient is progressing well toward LTGs and LTG #2 modified since it was already met. Sx are reproduced with forward UBE and median nerve glide. He reports improving posture at home and being compliant with neural glides. Still very tight and tender in right pectorals.    PT Frequency  2x / week    PT Duration  6 weeks    PT Treatment/Interventions  Patient/family education;ADLs/Self Care Home  Management;Cryotherapy;Electrical Stimulation;Iontophoresis 52m/ml Dexamethasone;Moist Heat;Ultrasound;Traction;Manual techniques;Dry needling;Neuromuscular re-education;Functional mobility training;Therapeutic activities;Therapeutic exercise    PT Next Visit Plan  focus on nerve tension release, shoulder and upper back strengthening; IASTM; DN prn    PT Home Exercise Plan  [id number]      Patient will benefit from skilled therapeutic intervention in order to improve the following deficits and impairments:  Pain, Increased fascial restricitons, Increased muscle spasms, Decreased strength, Decreased mobility, Decreased range of motion, Decreased activity tolerance  Visit Diagnosis: Acute pain of right shoulder  Other symptoms and signs involving the musculoskeletal system  Muscle weakness (generalized)  Abnormal posture     Problem List Patient Active Problem List   Diagnosis Date Noted  . Upper back pain on right side 07/19/2019  . Acute pain of right shoulder 07/19/2019  . Numbness and tingling of right arm 07/17/2019  . Venous insufficiency of both lower extremities 05/01/2018  . Borderline high cholesterol 09/04/2017  . Former heavy tobacco smoker 09/03/2017  . Class 3 severe obesity due to excess calories with serious comorbidity and body mass index (BMI) of 50.0 to 59.9 in adult ([place] 09/03/2017  . Mild persistent asthma without complication 083/38/2505 . Allergic state 08/04/2014  . Hypertension goal BP (blood pressure) < 130/80 08/04/2014  . OSA on CPAP 08/04/2014    JMadelyn FlavorsPT 08/03/2019, 10:40 AM  [facility] [address]Phone: [phone]  Fax:  [phone] Name: Jordan HARTLAGEMRN: [id number] of Birth: 908-May-1967

## 2019-08-05 ENCOUNTER — Ambulatory Visit (INDEPENDENT_AMBULATORY_CARE_PROVIDER_SITE_OTHER): Payer: Managed Care, Other (non HMO) | Admitting: Physical Therapy

## 2019-08-05 ENCOUNTER — Encounter: Payer: Self-pay | Admitting: Physical Therapy

## 2019-08-05 ENCOUNTER — Other Ambulatory Visit: Payer: Self-pay

## 2019-08-05 DIAGNOSIS — M25511 Pain in right shoulder: Secondary | ICD-10-CM

## 2019-08-05 DIAGNOSIS — R293 Abnormal posture: Secondary | ICD-10-CM | POA: Diagnosis not present

## 2019-08-05 DIAGNOSIS — R29898 Other symptoms and signs involving the musculoskeletal system: Secondary | ICD-10-CM | POA: Diagnosis not present

## 2019-08-05 DIAGNOSIS — M6281 Muscle weakness (generalized): Secondary | ICD-10-CM

## 2019-08-05 NOTE — Therapy (Signed)
[place], [place], [address] Phone: [phone]   Fax:  [phone]  Physical Therapy Treatment  Patient Details  Name: Jordan Moody MRN: [id number] Date of Birth: 1966-01-02 Referring Provider (PT): Iran Planas   Encounter Date: 08/05/2019  PT End of Session - 08/05/19 1709    Visit Number  16    Number of Visits  24    Date for PT Re-Evaluation  09/03/19    PT Start Time  6010    PT Stop Time  1732    PT Time Calculation (min)  27 min    Activity Tolerance  Patient tolerated treatment well    Behavior During Therapy  [facility] for tasks assessed/performed       Past Medical History:  Diagnosis Date  . Allergy   . Asthma   . Borderline high cholesterol 09/04/2017   10-yr ASCVD risk 4.9%  . Colon polyps   . Former smoker   . Hypertension   . Obesity   . OSA (obstructive sleep apnea)   . Venous insufficiency of both lower extremities 05/01/2018  . Venous ulcer, limited to breakdown of skin ([place]) 05/01/2018    Past Surgical History:  Procedure Laterality Date  . COLONOSCOPY W/ POLYPECTOMY    . KNEE ARTHROSCOPY Right     There were no vitals filed for this visit.  Subjective Assessment - 08/05/19 1709    Subjective  Pt reports he hasn't had any pain in 2 days.  Feeling pretty good today.    Currently in Pain?  No/denies    Pain Score  0-No pain         [facility] PT Assessment - 08/05/19 0001      Assessment   Medical Diagnosis  N/T Rt arm; acute pain Rt shoulder, upper back pain    Referring Provider (PT)  Iran Planas    Onset Date/Surgical Date  02/23/19    Hand Dominance  Right    Next MD Visit  PRN     Prior Therapy  for neck       [facility] Adult PT Treatment/Exercise - 08/05/19 0001      Neck Exercises: Machines for Strengthening   UBE (Upper Arm Bike)  L4: 1.5 min forward/ 1.5 min backward - standing       Shoulder Exercises: Stretch   Other Shoulder Stretches  mid-level  doorway stretch x 2 reps of 15 sec;  bilat shoulder ext stretchin holding door frame x 15 sec - stopped due to increased symptoms.   Rt radial nerve flossing x 10 reps with head tilting.     Other Shoulder Stretches  Rt tricep stretch with wall assist x 20 sec; overhead door stretch x 2 reps of 15 sec;       Manual Therapy   Soft tissue mobilization  STM with pin and stretch to Rt pec with pt in supine; Cross fiber friction to Rt pec at sternal attachment;  IASTM to Rt pec, bicep (prox) and distal tricep to decrease fascial restrictions.          PT Long Term Goals - 08/05/19 1808      PT LONG TERM GOAL #1   Title  Patient to demo negative ULTT in RUE.    Status  On-going      PT LONG TERM GOAL #2   Title  Patient to report decrease pain in right shoulder by 75% or more with ADLS    Baseline  2/3 better 08/03/19    Time  6    Status  Partially Met      PT LONG TERM GOAL #3   Title  Patient to report 75% decrease in thumb numbness.    Status  Partially Met      PT LONG TERM GOAL #4   Title  Patient to demo 5/5 right shoulder and upper back strength    Status  On-going      PT LONG TERM GOAL #5   Title  ---            Plan - 08/05/19 1809    Clinical Impression Statement  Pt tolerated UBE well, however symptoms are reproduced with bilat bicep stretch (holdiing door frame in shoulder ext).  Pt continues to be very tight and tender in Rt pec, especially along sternal attachments.  Some fascial restrictions noted in distal tricep and pec minor.  Pt remained painfree throughout session; one episode of symptoms in Rt thumb, quickly resolved with respositioning of arms.   Progressing well towards LTGs.    PT Frequency  2x / week    PT Duration  6 weeks    PT Treatment/Interventions  Patient/family education;ADLs/Self Care Home Management;Cryotherapy;Electrical Stimulation;Iontophoresis 63m/ml Dexamethasone;Moist Heat;Ultrasound;Traction;Manual techniques;Dry needling;Neuromuscular  re-education;Functional mobility training;Therapeutic activities;Therapeutic exercise    PT Next Visit Plan  focus on nerve tension release, shoulder and upper back strengthening; IASTM; ionto patch if area in Rt ant shoulder persists.    PT Home Exercise Plan  [id number]      Patient will benefit from skilled therapeutic intervention in order to improve the following deficits and impairments:  Pain, Increased fascial restricitons, Increased muscle spasms, Decreased strength, Decreased mobility, Decreased range of motion, Decreased activity tolerance  Visit Diagnosis: Acute pain of right shoulder  Other symptoms and signs involving the musculoskeletal system  Muscle weakness (generalized)  Abnormal posture     Problem List Patient Active Problem List   Diagnosis Date Noted  . Upper back pain on right side 07/19/2019  . Acute pain of right shoulder 07/19/2019  . Numbness and tingling of right arm 07/17/2019  . Venous insufficiency of both lower extremities 05/01/2018  . Borderline high cholesterol 09/04/2017  . Former heavy tobacco smoker 09/03/2017  . Class 3 severe obesity due to excess calories with serious comorbidity and body mass index (BMI) of 50.0 to 59.9 in adult ([place] 09/03/2017  . Mild persistent asthma without complication 023/30/0762 . Allergic state 08/04/2014  . Hypertension goal BP (blood pressure) < 130/80 08/04/2014  . OSA on CPAP 08/04/2014   JKerin Perna PTA 08/05/19 6:16 PM  [facility] [place] [address]Phone: [phone]  Fax:  [phone] Name: Jordan FORDHAMMRN: [id number] of Birth: 9January 10, 1967

## 2019-08-09 ENCOUNTER — Other Ambulatory Visit: Payer: Self-pay

## 2019-08-09 ENCOUNTER — Ambulatory Visit (INDEPENDENT_AMBULATORY_CARE_PROVIDER_SITE_OTHER): Payer: Managed Care, Other (non HMO) | Admitting: Physical Therapy

## 2019-08-09 DIAGNOSIS — M25511 Pain in right shoulder: Secondary | ICD-10-CM | POA: Diagnosis not present

## 2019-08-09 DIAGNOSIS — R293 Abnormal posture: Secondary | ICD-10-CM

## 2019-08-09 DIAGNOSIS — R29898 Other symptoms and signs involving the musculoskeletal system: Secondary | ICD-10-CM

## 2019-08-09 DIAGNOSIS — M6281 Muscle weakness (generalized): Secondary | ICD-10-CM | POA: Diagnosis not present

## 2019-08-09 NOTE — Therapy (Signed)
[place], [place], [address] Phone: [phone]   Fax:  [phone]  Physical Therapy Treatment  Patient Details  Name: Jordan Moody MRN: [id number] Date of Birth: 11-Jan-1966 Referring Provider (PT): Iran Planas   Encounter Date: 08/09/2019  PT End of Session - 08/09/19 0852    Visit Number  17    Number of Visits  24    Date for PT Re-Evaluation  09/03/19    PT Start Time  0850    PT Stop Time  0917    PT Time Calculation (min)  27 min    Activity Tolerance  Patient tolerated treatment well;No increased pain    Behavior During Therapy  WFL for tasks assessed/performed       Past Medical History:  Diagnosis Date  . Allergy   . Asthma   . Borderline high cholesterol 09/04/2017   10-yr ASCVD risk 4.9%  . Colon polyps   . Former smoker   . Hypertension   . Obesity   . OSA (obstructive sleep apnea)   . Venous insufficiency of both lower extremities 05/01/2018  . Venous ulcer, limited to breakdown of skin ([place]) 05/01/2018    Past Surgical History:  Procedure Laterality Date  . COLONOSCOPY W/ POLYPECTOMY    . KNEE ARTHROSCOPY Right     There were no vitals filed for this visit.  Subjective Assessment - 08/09/19 0853    Subjective  Pt reports he's "better overall".  He was sore in chest after last visit.  Less episodes of numbness in thumb.    Pertinent History  HTN; Rt knee sugery at 53 yr old; arthritis; obesity; asthma; Rt shoulder pain for ~ 15 yrs    Patient Stated Goals  get rid of pain  in shoulder and get rid of numbness    Currently in Pain?  No/denies         [facility] PT Assessment - 08/09/19 0001      Assessment   Medical Diagnosis  N/T Rt arm; acute pain Rt shoulder, upper back pain    Referring Provider (PT)  Iran Planas    Onset Date/Surgical Date  02/23/19    Hand Dominance  Right    Next MD Visit  PRN     Prior Therapy  for neck        [facility] Adult PT  Treatment/Exercise - 08/09/19 0001      Shoulder Exercises: ROM/Strengthening   UBE (Upper Arm Bike)  L4: 1 min forward, 1 min backward.       Shoulder Exercises: Stretch   Other Shoulder Stretches  mid-level doorway stretch x 2 reps of 15 sec;  Rt radial nerve flossing x 10 reps with head tilting.     Other Shoulder Stretches  Rt tricep stretch with wall assist x 20 sec; overhead door stretch x 2 reps of 15 sec;       Manual Therapy   Manual Therapy  Myofascial release    Soft tissue mobilization  STM with pin and stretch to Rt pec with pt in supine; STM to Rt pec including Cross fiber friction at sternal attachment;  IASTM to Rt bicep, and lateral forearm  to decrease fascial restrictions.     Myofascial Release  MFR to Rt fascia along Rt rib cage, pec majr, tricep.          PT Long Term Goals - 08/05/19 1808      PT LONG TERM GOAL #1  Title  Patient to demo negative ULTT in RUE.    Status  On-going      PT LONG TERM GOAL #2   Title  Patient to report decrease pain in right shoulder by 75% or more with ADLS    Baseline  2/3 better 08/03/19    Time  6    Status  Partially Met      PT LONG TERM GOAL #3   Title  Patient to report 75% decrease in thumb numbness.    Status  Partially Met      PT LONG TERM GOAL #4   Title  Patient to demo 5/5 right shoulder and upper back strength    Status  On-going      PT LONG TERM GOAL #5   Title  ---            Plan - 08/09/19 3086    Clinical Impression Statement  Pt had slight numbness in thumb with UBE backwards at end of 1 min; resolved with rest.  point tender and tightness noted in tissue along Rt ribs at sternal attachment and ant portion of ribs 6 and 7, as well as lateral tricep and lateral epicondyle.  Palpable release of tissue tightness after STM/IASTM. Pt not able to elicit numbness in thumb with shoulder ext stretch after manual therapy.   Pt making good gains towards remaining goals.    PT Frequency  2x / week    PT  Duration  6 weeks    PT Treatment/Interventions  Patient/family education;ADLs/Self Care Home Management;Cryotherapy;Electrical Stimulation;Iontophoresis 62m/ml Dexamethasone;Moist Heat;Ultrasound;Traction;Manual techniques;Dry needling;Neuromuscular re-education;Functional mobility training;Therapeutic activities;Therapeutic exercise    PT Next Visit Plan  focus on nerve tension release, shoulder and upper back strengthening; IASTM/DN    PT Home Exercise Plan  [id number]      Patient will benefit from skilled therapeutic intervention in order to improve the following deficits and impairments:  Pain, Increased fascial restricitons, Increased muscle spasms, Decreased strength, Decreased mobility, Decreased range of motion, Decreased activity tolerance  Visit Diagnosis: Acute pain of right shoulder  Other symptoms and signs involving the musculoskeletal system  Muscle weakness (generalized)  Abnormal posture     Problem List Patient Active Problem List   Diagnosis Date Noted  . Upper back pain on right side 07/19/2019  . Acute pain of right shoulder 07/19/2019  . Numbness and tingling of right arm 07/17/2019  . Venous insufficiency of both lower extremities 05/01/2018  . Borderline high cholesterol 09/04/2017  . Former heavy tobacco smoker 09/03/2017  . Class 3 severe obesity due to excess calories with serious comorbidity and body mass index (BMI) of 50.0 to 59.9 in adult ([place] 09/03/2017  . Mild persistent asthma without complication 095/28/4132 . Allergic state 08/04/2014  . Hypertension goal BP (blood pressure) < 130/80 08/04/2014  . OSA on CPAP 08/04/2014   JKerin Perna PTA 08/09/19 9:29 AM   [facility] [address] [address]Phone: [phone]  Fax:  [phone] Name: Jordan CHAPELMRN: [id number] of Birth: [birth year]

## 2019-08-11 ENCOUNTER — Encounter: Payer: Self-pay | Admitting: Physical Therapy

## 2019-08-17 ENCOUNTER — Encounter: Payer: Self-pay | Admitting: Physical Therapy

## 2019-08-17 ENCOUNTER — Other Ambulatory Visit: Payer: Self-pay

## 2019-08-17 ENCOUNTER — Ambulatory Visit (INDEPENDENT_AMBULATORY_CARE_PROVIDER_SITE_OTHER): Payer: Managed Care, Other (non HMO) | Admitting: Physical Therapy

## 2019-08-17 DIAGNOSIS — M6281 Muscle weakness (generalized): Secondary | ICD-10-CM | POA: Diagnosis not present

## 2019-08-17 DIAGNOSIS — R29898 Other symptoms and signs involving the musculoskeletal system: Secondary | ICD-10-CM

## 2019-08-17 NOTE — Therapy (Addendum)
[place], [place], [address] Phone: [phone]   Fax:  [phone]  Physical Therapy Treatment and Discharge Summary  Patient Details  Name: Jordan Moody MRN: [id number] Date of Birth: [birth year] Referring Provider (PT): Iran Planas   Encounter Date: 08/17/2019  PT End of Session - 08/17/19 0852    Visit Number  18    Number of Visits  24    Date for PT Re-Evaluation  09/03/19    PT Start Time  0852    PT Stop Time  0931    PT Time Calculation (min)  39 min    Activity Tolerance  Patient tolerated treatment well;No increased pain    Behavior During Therapy  WFL for tasks assessed/performed       Past Medical History:  Diagnosis Date  . Allergy   . Asthma   . Borderline high cholesterol 09/04/2017   10-yr ASCVD risk 4.9%  . Colon polyps   . Former smoker   . Hypertension   . Obesity   . OSA (obstructive sleep apnea)   . Venous insufficiency of both lower extremities 05/01/2018  . Venous ulcer, limited to breakdown of skin ([place]) 05/01/2018    Past Surgical History:  Procedure Laterality Date  . COLONOSCOPY W/ POLYPECTOMY    . KNEE ARTHROSCOPY Right     There were no vitals filed for this visit.  Subjective Assessment - 08/17/19 0852    Subjective  Patient reports no numbness or pain since his last visit. He reports feeling some tigtness in the top of the right shoulder when sitting on his couch.    Pertinent History  HTN; Rt knee sugery at 53 yr old; arthritis; obesity; asthma; Rt shoulder pain for ~ 15 yrs    Patient Stated Goals  get rid of pain  in shoulder and get rid of numbness    Currently in Pain?  No/denies                       [facility] Adult PT Treatment/Exercise - 08/17/19 0001      Shoulder Exercises: ROM/Strengthening   UBE (Upper Arm Bike)  L4 x 4 min alternating each min      Shoulder Exercises: Stretch   Other Shoulder Stretches  mid-level  doorway stretch x 2 reps of 30 sec;  Rt radial and median nerve flossing x 5 reps eawith head tilting.     Other Shoulder Stretches  Rt tricep stretch with wall assist 4 x 15 sec; overhead door stretch x 2 reps of 15 sec;       Manual Therapy   Manual Therapy  Soft tissue mobilization    Soft tissue mobilization  IASTM to right pectorals near sternum and laterally, triceps, forearm, distal triceps. STM to left scalenes in supine with passive stretch.      Neck Exercises: Stretches   Other Neck Stretches  anterior neck stretch 3x30 sec right side, 2x30 left side with hand anchoring clavicle.                   PT Long Term Goals - 08/17/19 0854      PT LONG TERM GOAL #1   Title  Patient to demo negative ULTT in RUE.      PT LONG TERM GOAL #3   Title  Patient to report 75% decrease in thumb numbness.    Baseline  no sx since 08/10/19  Plan - 08/17/19 1441    Clinical Impression Statement  Patient continues to report improvement with no tingling in thumb since last visit. Tingling can still be provoked in clinic with nerve glides. Patient's tissue tension much improved in pectorals and in RUE today. Main areas to work on still pectorals. Mild tightness in distal triceps today. he responded well to STW in scalenes which were tight and painful on right.    PT Treatment/Interventions  Patient/family education;ADLs/Self Care Home Management;Cryotherapy;Electrical Stimulation;Iontophoresis 68m/ml Dexamethasone;Moist Heat;Ultrasound;Traction;Manual techniques;Dry needling;Neuromuscular re-education;Functional mobility training;Therapeutic activities;Therapeutic exercise    PT Next Visit Plan  Decreasing to 1x/wk and working toward d/c. Continue to focus on nerve tension release, scalene flexibility and shoulder and upper back strengthening;    PT Home Exercise Plan  [id number]      Patient will benefit from skilled therapeutic intervention in order to improve the  following deficits and impairments:  Pain, Increased fascial restricitons, Increased muscle spasms, Decreased strength, Decreased mobility, Decreased range of motion, Decreased activity tolerance  Visit Diagnosis: Other symptoms and signs involving the musculoskeletal system  Muscle weakness (generalized)     Problem List Patient Active Problem List   Diagnosis Date Noted  . Upper back pain on right side 07/19/2019  . Acute pain of right shoulder 07/19/2019  . Numbness and tingling of right arm 07/17/2019  . Venous insufficiency of both lower extremities 05/01/2018  . Borderline high cholesterol 09/04/2017  . Former heavy tobacco smoker 09/03/2017  . Class 3 severe obesity due to excess calories with serious comorbidity and body mass index (BMI) of 50.0 to 59.9 in adult ([place] 09/03/2017  . Mild persistent asthma without complication 088/41/6606 . Allergic state 08/04/2014  . Hypertension goal BP (blood pressure) < 130/80 08/04/2014  . OSA on CPAP 08/04/2014    JMadelyn FlavorsPT 08/17/2019, 2:46 PM  [facility] [address]Phone: [phone]  Fax:  [phone] Name: Jordan ABERMRN: [id number] of Birth: 912/04/1966 PHYSICAL THERAPY DISCHARGE SUMMARY  Visits from Start of Care: 18  Current functional level related to goals / functional outcomes: Patient stopped into clinic and reported his symptoms had resolved and he is pleased with his current level of function.    Remaining deficits: See above   Education / Equipment: HEP - patient to continue with stretches and strengthening. Plan: Patient agrees to discharge.  Patient goals were partially met. Patient is being discharged due to being pleased with the current functional level.  ?????    JMadelyn Flavors PT 09/23/19 8:10 AM  [facility] [place] [address] [phone](office) [phone](fax)

## 2019-08-19 ENCOUNTER — Encounter: Payer: Self-pay | Admitting: Physical Therapy

## 2019-08-24 ENCOUNTER — Other Ambulatory Visit: Payer: Self-pay

## 2019-08-24 ENCOUNTER — Ambulatory Visit (INDEPENDENT_AMBULATORY_CARE_PROVIDER_SITE_OTHER): Payer: Managed Care, Other (non HMO) | Admitting: Physical Therapy

## 2019-08-24 DIAGNOSIS — R293 Abnormal posture: Secondary | ICD-10-CM

## 2019-08-24 DIAGNOSIS — R29898 Other symptoms and signs involving the musculoskeletal system: Secondary | ICD-10-CM

## 2019-08-24 DIAGNOSIS — M6281 Muscle weakness (generalized): Secondary | ICD-10-CM

## 2019-08-24 NOTE — Therapy (Signed)
[facility] [place] [address] [place], [place], [address] Phone: [phone]   Fax:  [phone]  Patient Details  Name: Jordan Moody MRN: [id number] Date of Birth: April 12, 1966 Referring Provider:  Nolene Ebbs  Encounter Date: 08/24/2019  Patient arrived to session and reported his symptoms in arm and neck have resolved since last visit.  Patient requested to cancel today's therapy appt and hold therapy while he continues to work on HEP. Encouraged patient to continue daily stretches and self massage; he verbalized understanding.  Spoke to supervising PT regarding patients progress and request; will hold until 09/14/19.  If patient doesn't return at that time, will d/c.   Mayer Camel, PTA 08/24/19 8:13 AM  [facility] [place] [address] [place], [place], [address] Phone: [phone]   Fax:  [phone]

## 2019-10-29 ENCOUNTER — Other Ambulatory Visit: Payer: Self-pay | Admitting: Physician Assistant

## 2019-10-29 DIAGNOSIS — I10 Essential (primary) hypertension: Secondary | ICD-10-CM

## 2019-11-15 ENCOUNTER — Ambulatory Visit: Payer: Managed Care, Other (non HMO)

## 2020-01-20 ENCOUNTER — Other Ambulatory Visit: Payer: Self-pay | Admitting: Physician Assistant

## 2020-01-20 DIAGNOSIS — I10 Essential (primary) hypertension: Secondary | ICD-10-CM

## 2020-01-20 NOTE — Telephone Encounter (Signed)
Needs to establish with new PCP

## 2020-01-20 NOTE — Telephone Encounter (Signed)
Patient switched providers. Please delete request.

## 2020-06-14 IMAGING — DX DG SHOULDER 2+V*R*
4 series · 4 of 4 positions shown · non-contrast
Comparison: No recent.

CLINICAL DATA: Right shoulder pain.

EXAM:
RIGHT SHOULDER - 2+ VIEW

[shoulder grashey]
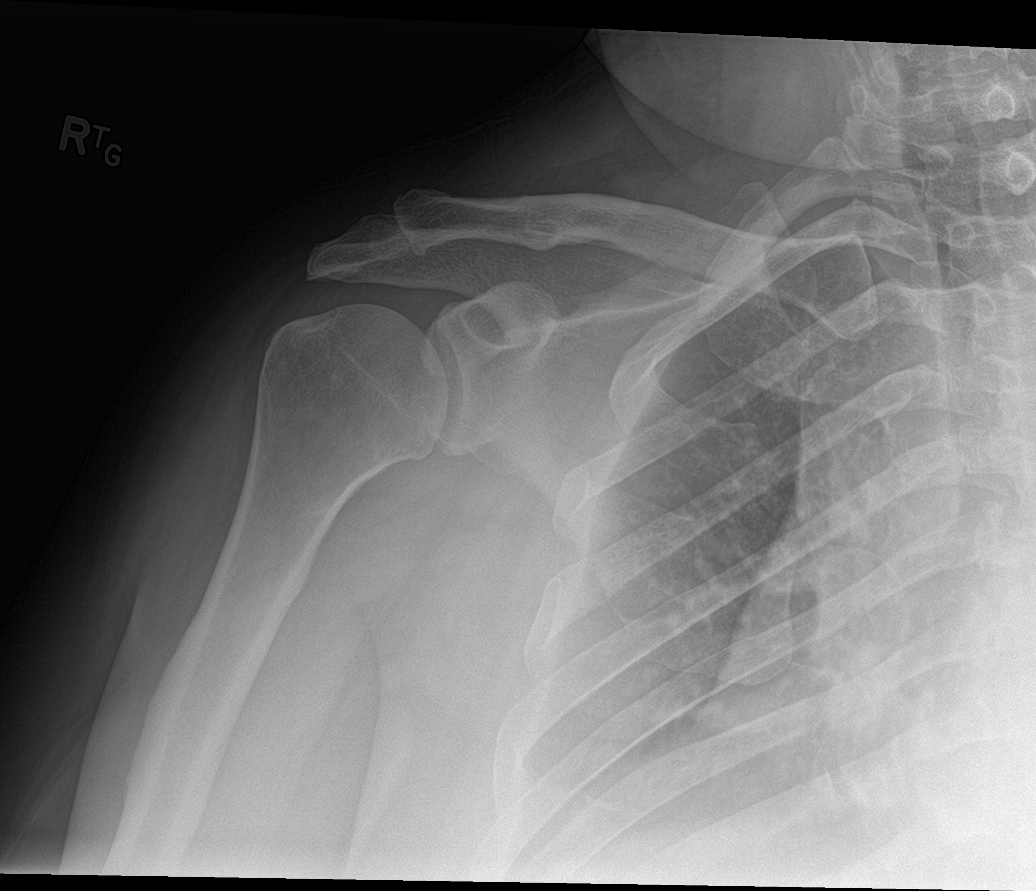

[shoulder y view (1 of 2)]
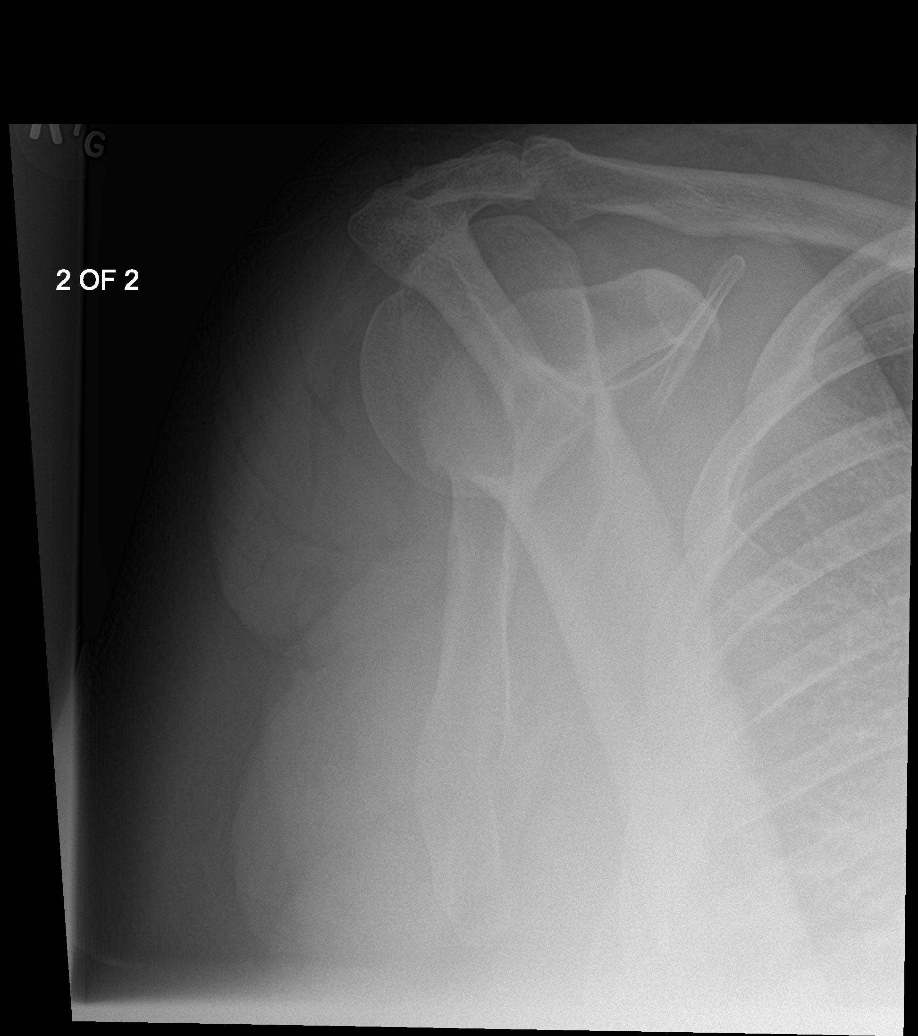

[shoulder axillary]
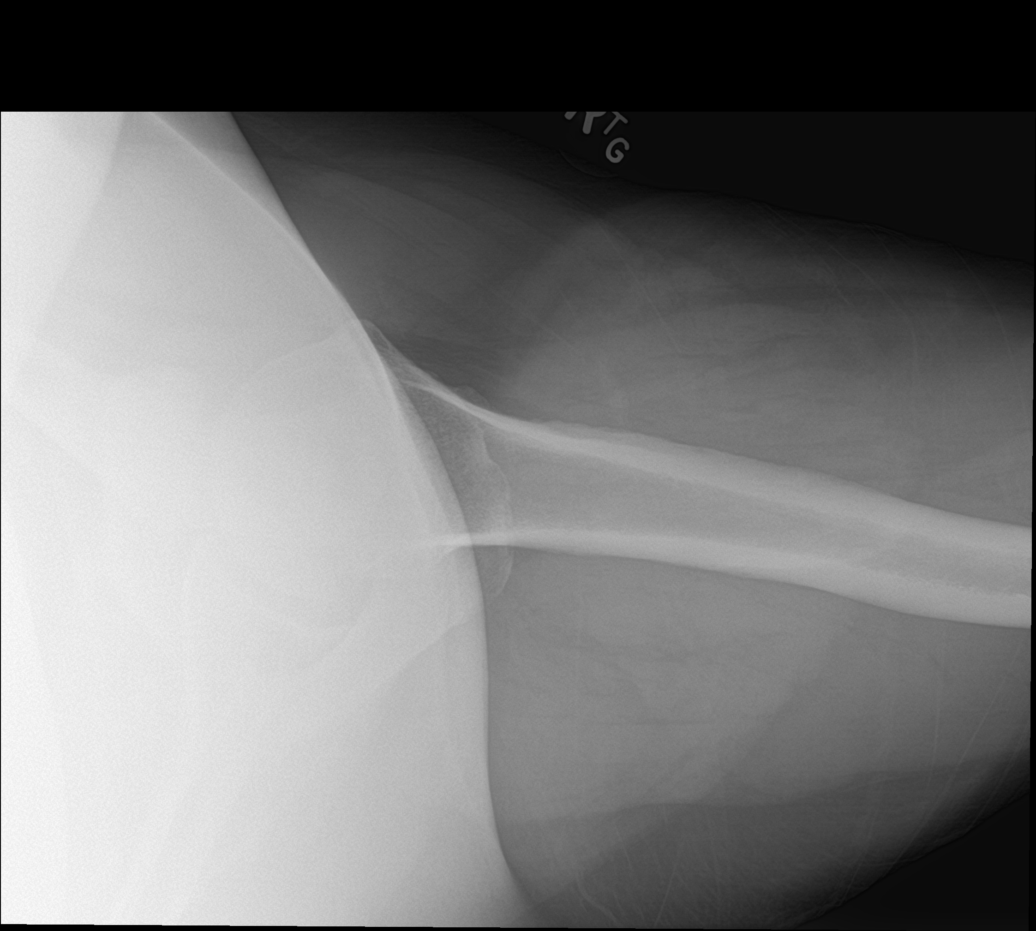

[shoulder y view (2 of 2)]
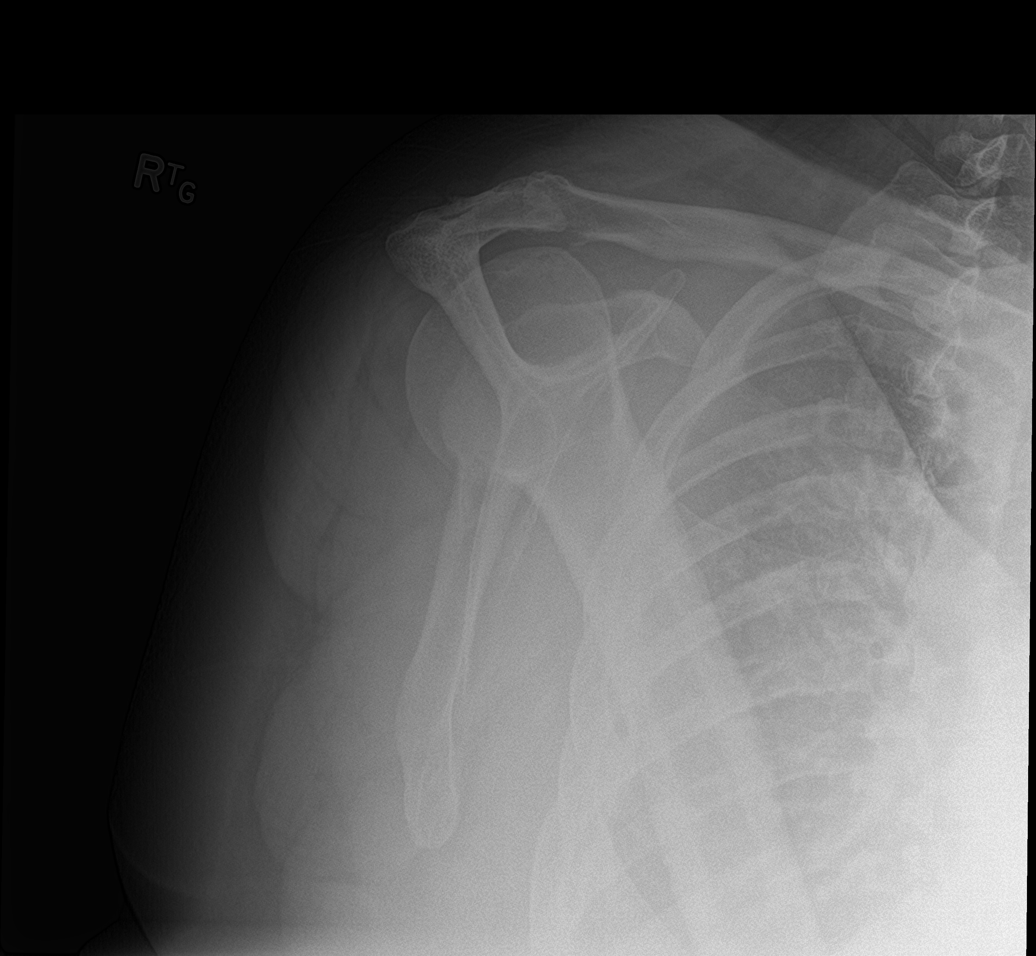

[4 of 4 positions shown; findings below may reference images not displayed]

FINDINGS: No acute bony or joint abnormality. No evidence of fracture or
dislocation. Acromioclavicular glenohumeral degenerative change.
IMPRESSION: Acromioclavicular glenohumeral degenerative change. No acute
abnormality.

## 2020-07-23 ENCOUNTER — Emergency Department: Admit: 2020-07-23 | Discharge status: Against Medical Advice | Payer: Self-pay

## 2020-07-23 ENCOUNTER — Emergency Department (INDEPENDENT_AMBULATORY_CARE_PROVIDER_SITE_OTHER)
Admission: EM | Admit: 2020-07-23 | Discharge: 2020-07-23 | Disposition: A | Discharge status: Home / Self Care | Payer: Managed Care, Other (non HMO) | Source: Home / Self Care

## 2020-07-23 ENCOUNTER — Other Ambulatory Visit: Payer: Self-pay

## 2020-07-23 DIAGNOSIS — S46911A Strain of unspecified muscle, fascia and tendon at shoulder and upper arm level, right arm, initial encounter: Secondary | ICD-10-CM

## 2020-07-23 MED ORDER — DEXAMETHASONE SODIUM PHOSPHATE 10 MG/ML IJ SOLN
10.0000 mg | Freq: Once | INTRAMUSCULAR | Status: AC
  Administered 2020-07-23: 10 mg via INTRAMUSCULAR

## 2020-07-23 MED ORDER — CYCLOBENZAPRINE HCL 10 MG PO TABS: 10.0000 mg | ORAL_TABLET | Freq: Three times a day (TID) | ORAL | 0 refills | Status: AC | PRN

## 2020-07-23 NOTE — ED Triage Notes (Signed)
Patient presents to Urgent Care with complaints of acute on chronic right shoulder pain since yesterday. Patient reports he was not doing anything in particular when it started hurting, did play golf last week but it did not hurt afterwards. Pt has had PT on the shoulder and several steroid injections. Hurts to lift the arm at all, no impact on ROM below the shoulder joint.

## 2020-07-23 NOTE — ED Provider Notes (Signed)
[facility]    CSN: [id number] Arrival date & time: 07/23/20  1351      History   Chief Complaint Chief Complaint  Patient presents with  . Shoulder Pain    Right    HPI Jordan Moody is a 54 y.o. male.   HPI  Patient present for right shoulder pain, which is a chronic problem. Patient has been seen and treated by Sports medicine previously and participated in PT. Current episode of pain onset gradually worsening times yesterday.  Patient reports engaging in some yard work but otherwise no other exertional injuries that may have precipitated an injury involving the right shoulder.  He is status post gastric bypass surgery and is unable to tolerate NSAIDs therefore has not taken anything for pain.  He denies any numbness or tingling involving the hands or fingers.  Pain is present with internal and external rotating movements and exacerbated by lifting objects. Past Medical History:  Diagnosis Date  . Allergy   . Asthma   . Borderline high cholesterol 09/04/2017   10-yr ASCVD risk 4.9%  . Colon polyps   . Former smoker   . Hypertension   . Obesity   . OSA (obstructive sleep apnea)   . Venous insufficiency of both lower extremities 05/01/2018  . Venous ulcer, limited to breakdown of skin (HCC) 05/01/2018    Patient Active Problem List   Diagnosis Date Noted  . Upper back pain on right side 07/19/2019  . Acute pain of right shoulder 07/19/2019  . Numbness and tingling of right arm 07/17/2019  . Venous insufficiency of both lower extremities 05/01/2018  . Borderline high cholesterol 09/04/2017  . Former heavy tobacco smoker 09/03/2017  . Class 3 severe obesity due to excess calories with serious comorbidity and body mass index (BMI) of 50.0 to 59.9 in adult (HCC) 09/03/2017  . Mild persistent asthma without complication 09/03/2017  . Allergic state 08/04/2014  . Hypertension goal BP (blood pressure) < 130/80 08/04/2014  . OSA on CPAP 08/04/2014    Past  Surgical History:  Procedure Laterality Date  . COLONOSCOPY W/ POLYPECTOMY    . KNEE ARTHROSCOPY Right   . ROUX-EN-Y GASTRIC BYPASS         Home Medications    Prior to Admission medications   Medication Sig Start Date End Date Taking? Authorizing Provider  omeprazole (PRILOSEC) 40 MG capsule Take 40 mg by mouth daily.   Yes [provider]  ursodiol (ACTIGALL) 300 MG capsule Take 300 mg by mouth 2 (two) times daily.   Yes [provider]  ADVAIR DISKUS 100-50 MCG/DOSE AEPB Inhale 1 puff into the lungs 2 (two) times daily. 07/13/19   Jomarie Longs, PA-C  aspirin EC 81 MG tablet Take 1 tablet (81 mg total) by mouth daily. 09/03/17   Carlis Stable, PA-C  cyclobenzaprine (FLEXERIL) 10 MG tablet Take 1 tablet (10 mg total) by mouth 3 (three) times daily as needed for muscle spasms. 04/21/19   Rodolph Bong, MD  olmesartan-hydrochlorothiazide (BENICAR HCT) 40-25 MG tablet Take 1 tablet by mouth daily. NEEDS APPT 10/29/19   Jomarie Longs, PA-C    Family History Family History  Problem Relation Age of Onset  . Hypertension Mother   . Heart attack Maternal Grandfather   . Hyperlipidemia Father   . Heart attack Paternal Grandfather   . Colon cancer Paternal Grandfather   . Stroke Paternal Grandfather   . Diabetes Neg Hx     Social  History Social History   Tobacco Use  . Smoking status: Former Smoker    Packs/day: 1.00    Years: 28.00    Pack years: 28.00    Types: Cigarettes    Quit date: 05/19/2013    Years since quitting: 7.1  . Smokeless tobacco: Never Used  Vaping Use  . Vaping Use: Never used  Substance Use Topics  . Alcohol use: Not Currently    Alcohol/week: 6.0 standard drinks    Types: 6 Standard drinks or equivalent per week  . Drug use: No     Allergies   Nsaids  Review of Systems Review of Systems Pertinent negatives listed in HPI   Physical Exam Triage Vital Signs ED Triage Vitals  Enc Vitals Group     BP  07/23/20 1444 138/85     Pulse Rate 07/23/20 1444 85     Resp 07/23/20 1444 17     Temp 07/23/20 1444 98.5 F (36.9 C)     Temp Source 07/23/20 1444 Oral     SpO2 07/23/20 1444 97 %     Weight --      Height --      Head Circumference --      Peak Flow --      Pain Score 07/23/20 1441 10     Pain Loc --      Pain Edu? --      Excl. in GC? --    No data found.  Updated Vital Signs BP 138/85 (BP Location: Left Arm)   Pulse 85   Temp 98.5 F (36.9 C) (Oral)   Resp 17   SpO2 97%   Visual Acuity Right Eye Distance:   Left Eye Distance:   Bilateral Distance:    Right Eye Near:   Left Eye Near:    Bilateral Near:     Physical Exam General appearance: alert, obese, cooperative,  no distress Head: Normocephalic, without obvious abnormality, atraumatic Respiratory: Respirations even and unlabored, normal respiratory rate Heart: rate and rhythm normal. No gallop or murmurs noted on exam  Extremities: Right shoulder no gross deformities, limited ROM, 5/5 strength  Skin: Skin color, texture, turgor normal. No rashes seen  Psych: Appropriate mood and affect. Neurologic: Mental status: Alert, oriented to person, place, and time, thought content appropriate.   UC Treatments / Results  Labs (all labs ordered are listed, but only abnormal results are displayed) Labs Reviewed - No data to display  EKG   Radiology No results found.  Procedures Procedures (including critical care time)  Medications Ordered in UC Medications  dexamethasone (DECADRON) injection 10 mg (10 mg Intramuscular Given 07/23/20 1611)    Initial Impression / Assessment and Plan / UC Course  I have reviewed the triage vital signs and the nursing notes.  Pertinent labs & imaging results that were available during my care of the patient were reviewed by me and considered in my medical decision making (see chart for details).    Decadron 10 mg IM given today. Cyclobenzaprine PRN for  pain. Information provided to follow-up with  Orthopedics to rule out impingement syndrome.  Final Clinical Impressions(s) / UC Diagnoses   Final diagnoses:  Strain of right shoulder, initial encounter   Discharge Instructions   None    ED Prescriptions    Medication Sig Dispense Auth. Provider   cyclobenzaprine (FLEXERIL) 10 MG tablet Take 1 tablet (10 mg total) by mouth 3 (three) times daily as needed for muscle spasms. 30 tablet Joaquin Courts  S, FNP     PDMP not reviewed this encounter.   Bing Neighbors, [place] 07/28/20 [phone]
